# Patient Record
Sex: Male | Born: 1935 | Race: Black or African American | Hispanic: No | Marital: Married | Smoking: Never smoker
Health system: Southern US, Community
[De-identification: ages and names within clinical notes are randomized; demographics above are authoritative.]

---

## 2002-09-18 ENCOUNTER — Encounter: Payer: Self-pay | Admitting: Neurosurgery

## 2002-09-20 ENCOUNTER — Encounter: Payer: Self-pay | Admitting: Neurosurgery

## 2002-09-20 ENCOUNTER — Inpatient Hospital Stay (HOSPITAL_COMMUNITY): Admission: RE | Admit: 2002-09-20 | Discharge: 2002-09-21 | Payer: Self-pay | Admitting: Neurosurgery

## 2002-10-16 ENCOUNTER — Encounter: Admission: RE | Admit: 2002-10-16 | Discharge: 2002-10-16 | Payer: Self-pay | Admitting: Neurosurgery

## 2002-10-16 ENCOUNTER — Encounter: Payer: Self-pay | Admitting: Neurosurgery

## 2004-04-23 ENCOUNTER — Ambulatory Visit: Payer: Self-pay | Admitting: Family Medicine

## 2004-05-29 ENCOUNTER — Encounter: Admission: RE | Admit: 2004-05-29 | Discharge: 2004-05-29 | Payer: Self-pay | Admitting: Neurosurgery

## 2004-06-12 ENCOUNTER — Encounter: Admission: RE | Admit: 2004-06-12 | Discharge: 2004-06-12 | Payer: Self-pay | Admitting: Neurosurgery

## 2004-06-30 ENCOUNTER — Encounter: Admission: RE | Admit: 2004-06-30 | Discharge: 2004-06-30 | Payer: Self-pay | Admitting: Neurosurgery

## 2004-07-24 ENCOUNTER — Inpatient Hospital Stay (HOSPITAL_COMMUNITY): Admission: RE | Admit: 2004-07-24 | Discharge: 2004-07-25 | Payer: Self-pay | Admitting: Neurosurgery

## 2004-08-05 ENCOUNTER — Emergency Department (HOSPITAL_COMMUNITY): Admission: EM | Admit: 2004-08-05 | Discharge: 2004-08-06 | Payer: Self-pay | Admitting: Emergency Medicine

## 2004-08-07 ENCOUNTER — Ambulatory Visit: Payer: Self-pay | Admitting: Family Medicine

## 2004-08-27 ENCOUNTER — Ambulatory Visit: Payer: Self-pay | Admitting: Family Medicine

## 2004-10-29 ENCOUNTER — Ambulatory Visit: Payer: Self-pay | Admitting: Family Medicine

## 2004-12-31 ENCOUNTER — Ambulatory Visit: Payer: Self-pay | Admitting: Family Medicine

## 2005-03-10 ENCOUNTER — Ambulatory Visit: Payer: Self-pay | Admitting: Family Medicine

## 2005-04-09 IMAGING — CT CT ABDOMEN W/ CM
1 of 3 series · 13 of 32 positions shown, 18 images · IV contrast (omnipaque)
Comparison: none

CLINICAL DATA: Abdominal pain for one day.  Question diverticulitis.  Patient is status-post laminectomy on 07/24/04.
TECHNIQUE: Contiguous axial CT images were taken through the abdomen and pelvis after the administration of 100 cc of Omnipaque 300 contrast material.

[Series 2: abd pelvis · axial · 0.70mm/px · z∈[-417,+23]mm · 13 of 102 slices shown, 18 images]
[im 7/102  soft-tissue]
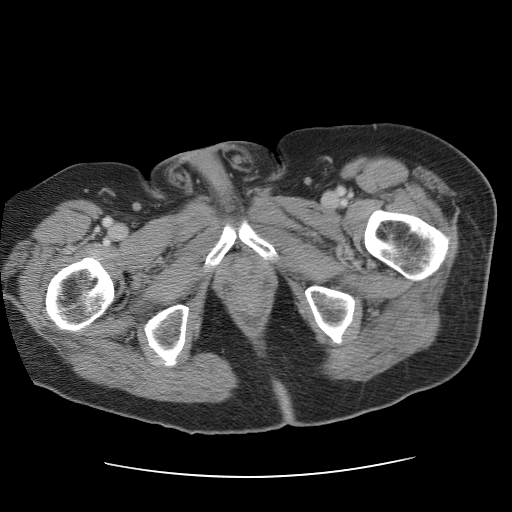
[im 7/102  bone]
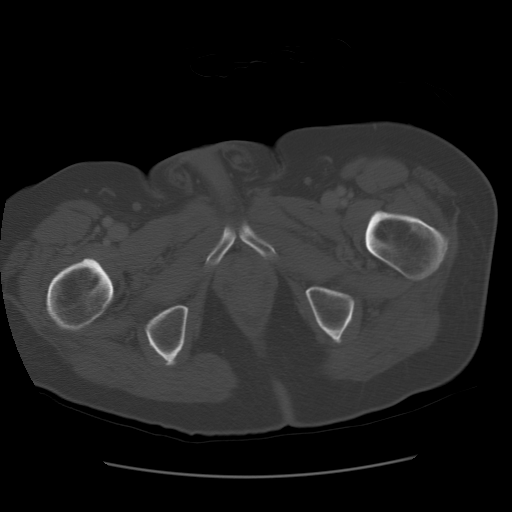
[im 13/102  soft-tissue]
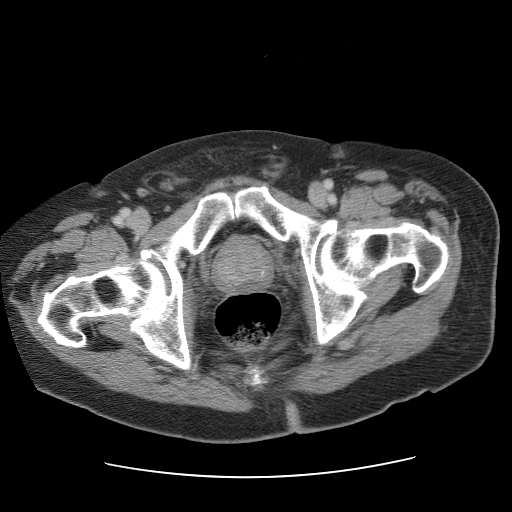
[im 26/102  soft-tissue]
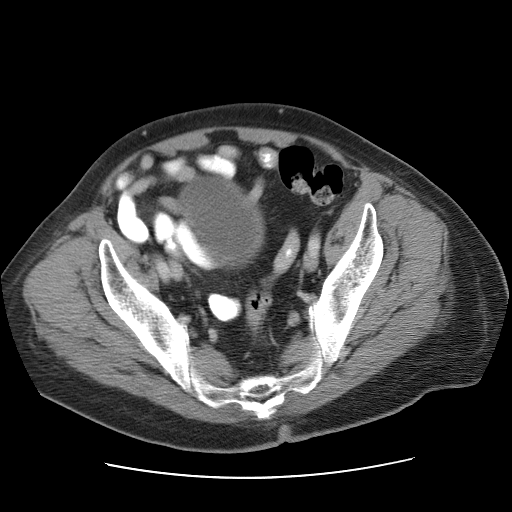
[im 32/102  soft-tissue]
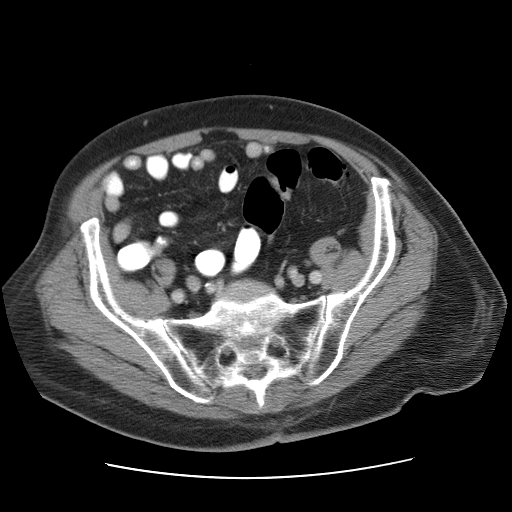
[im 38/102  soft-tissue]
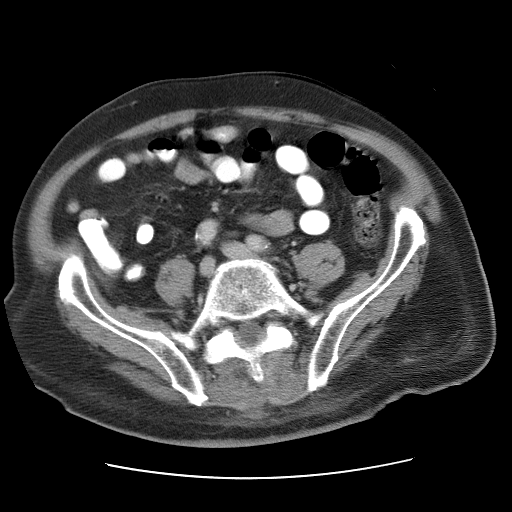
[im 45/102  soft-tissue]
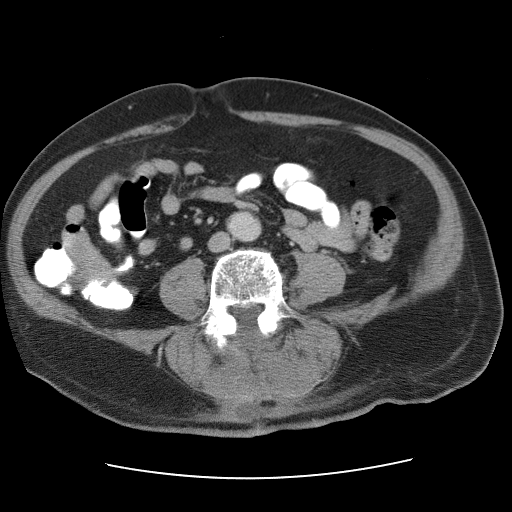
[im 57/102  soft-tissue]
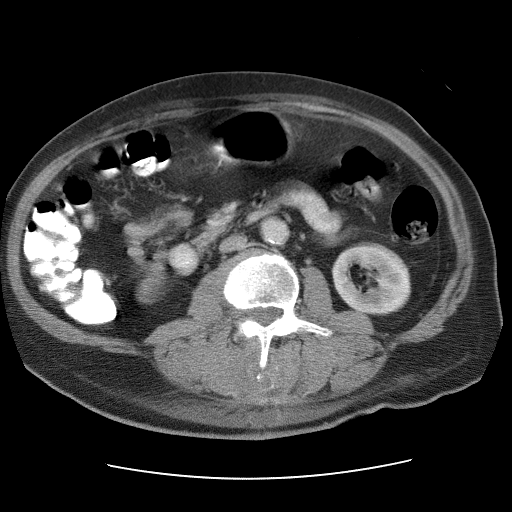
[im 64/102  soft-tissue]
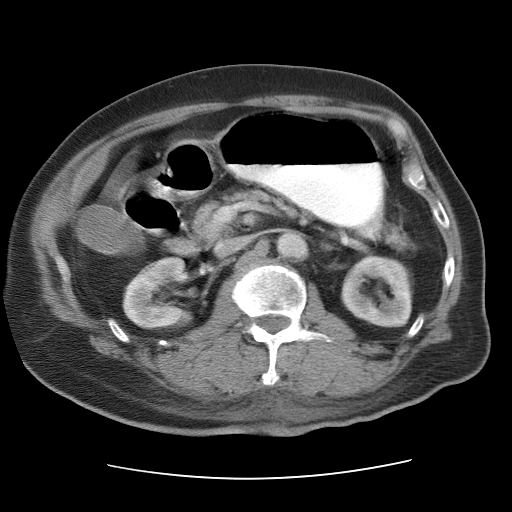
[im 70/102  soft-tissue]
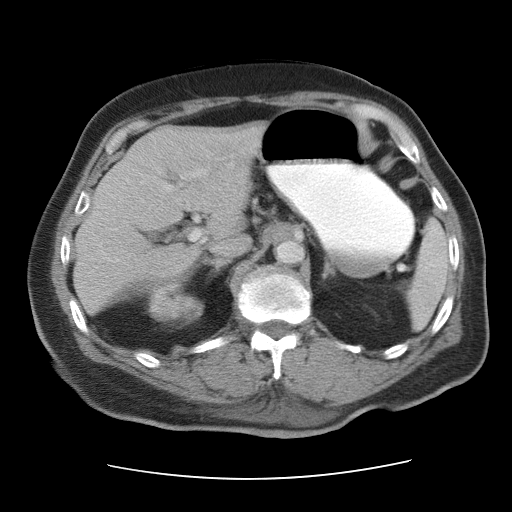
[im 70/102  bone]
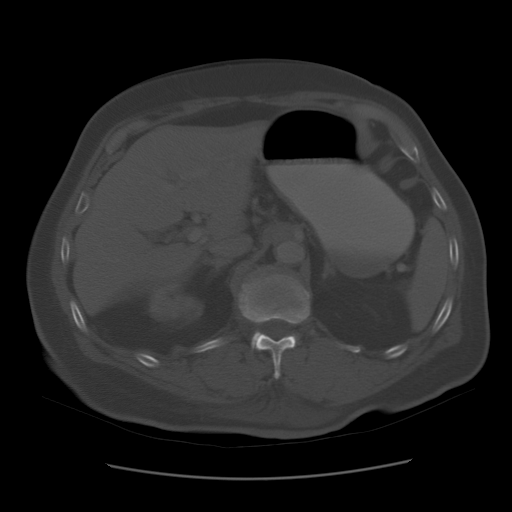
[im 76/102  soft-tissue]
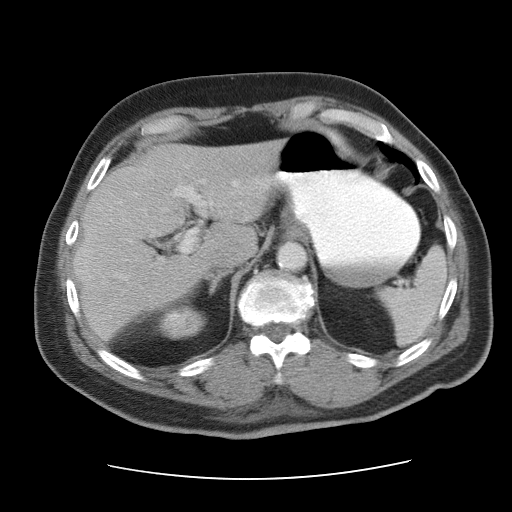
[im 76/102  lung]
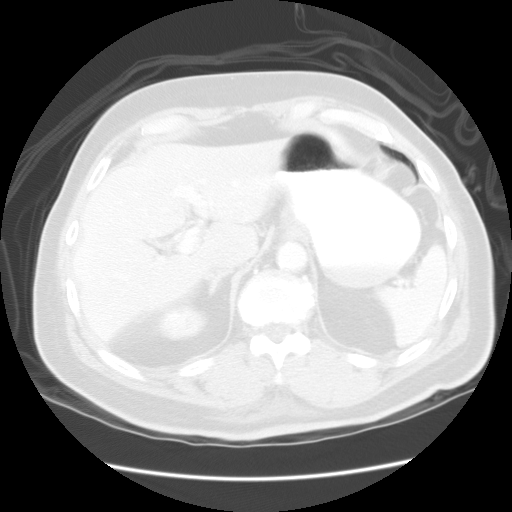
[im 83/102  lung]
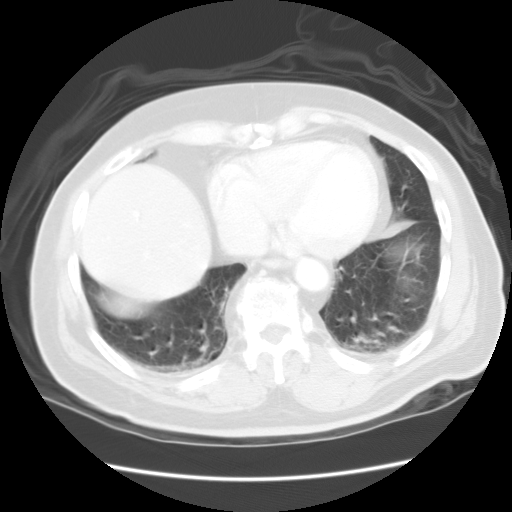
[im 89/102  soft-tissue]
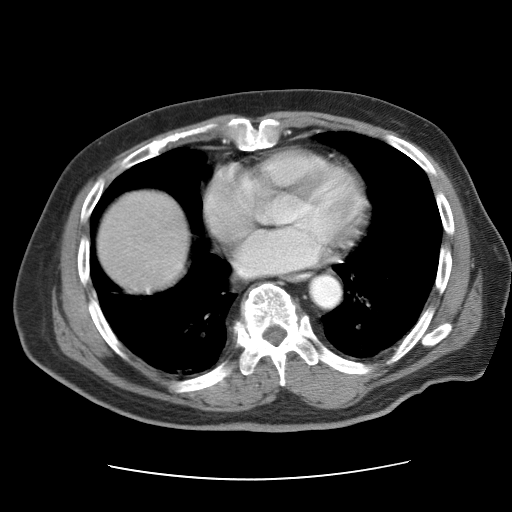
[im 89/102  lung]
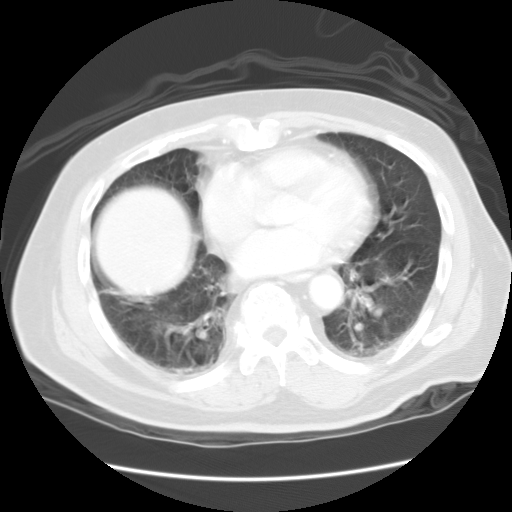
[im 95/102  soft-tissue]
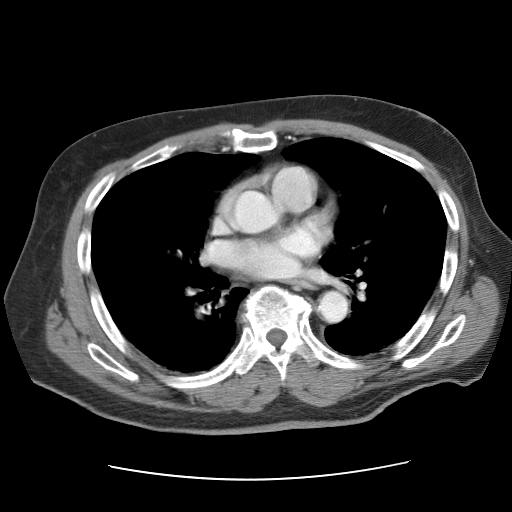
[im 95/102  lung]
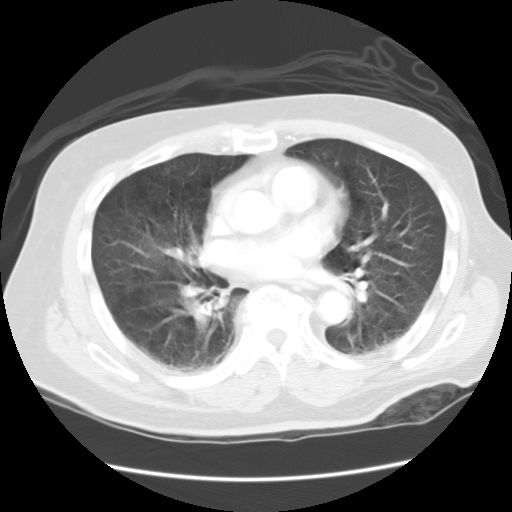

[13 of 32 positions shown; findings below may reference images not displayed]

FINDINGS: CT ABDOMEN WITH CONTRAST:
 Imaged lung parenchyma demonstrates some dependent atelectasis but no pleural or pericardial effusion.  Heart size is normal.  
 Images of the abdomen are degraded by patient motion.  Liver, gallbladder, spleen, pancreas, kidneys, and adrenal glands are all unremarkable.  Note is made of fluid tracking along the subcutaneous soft tissues of the back in this patient who is status-post laminectomy from L-3 to L-5 on 07/24/04.  No abdominal lymphadenopathy or fluid collection is seen.
IMPRESSION: No acute findings in the abdomen.  Subcutaneous fluid tracking along the back is compatible with patient?s history of laminectomy.  
 CT PELVIS WITH CONTRAST:
 As in the abdomen, imaging is degraded by motion.  On images 53 to 57, the distal abdominal aorta demonstrates a line of hypointense signal through the contrast column.  This could be secondary to patient motion although limited dissection cannot be completely excluded.  Imaged colon appears normal.  No diverticulitis.  Prostate gland is enlarged with some calcifications present.  Urinary bladder appears normal.  No free pelvic fluid collections or lymphadenopathy.  Patient is status-post laminectomy from L-3 to L-5.  There is fluid and high attenuation material in the surgical bed likely representing hematoma and possibly a small postoperative seroma formation.
IMPRESSION: 1.  Question limited distal abdominal aortic dissection.  The finding could be secondary to artifact due to patient motion.  Note is made that the patient?s creatinine was 1.4 on today?s scan.  Repeat abdomen and pelvis CT scan per CT angiogram protocol is recommended in one to two days with recheck of creatine prior to scanning.
 2.  Postoperative change in the lower lumbar spine.  No discrete abscess is identified on today?s study.  Soft tissue fullness and fluid in the paraspinous soft tissues and subcutaneous tissues may reflect postoperative change.  Early phlegmon formation cannot be completely excluded.

## 2005-04-22 ENCOUNTER — Ambulatory Visit: Payer: Self-pay | Admitting: Family Medicine

## 2005-05-20 ENCOUNTER — Ambulatory Visit: Payer: Self-pay | Admitting: Family Medicine

## 2005-06-03 ENCOUNTER — Ambulatory Visit: Payer: Self-pay | Admitting: Family Medicine

## 2006-01-27 ENCOUNTER — Encounter: Admission: RE | Admit: 2006-01-27 | Discharge: 2006-01-27 | Payer: Self-pay | Admitting: Neurosurgery

## 2010-06-14 ENCOUNTER — Encounter: Payer: Self-pay | Admitting: Vascular Surgery

## 2014-05-29 DIAGNOSIS — D649 Anemia, unspecified: Secondary | ICD-10-CM | POA: Diagnosis not present

## 2014-06-03 DIAGNOSIS — D631 Anemia in chronic kidney disease: Secondary | ICD-10-CM | POA: Diagnosis not present

## 2014-06-03 DIAGNOSIS — N189 Chronic kidney disease, unspecified: Secondary | ICD-10-CM | POA: Diagnosis not present

## 2014-06-04 DIAGNOSIS — D631 Anemia in chronic kidney disease: Secondary | ICD-10-CM | POA: Diagnosis not present

## 2014-06-04 DIAGNOSIS — N189 Chronic kidney disease, unspecified: Secondary | ICD-10-CM | POA: Diagnosis not present

## 2014-06-25 DIAGNOSIS — E784 Other hyperlipidemia: Secondary | ICD-10-CM | POA: Diagnosis not present

## 2014-06-25 DIAGNOSIS — D649 Anemia, unspecified: Secondary | ICD-10-CM | POA: Diagnosis not present

## 2014-06-25 DIAGNOSIS — I10 Essential (primary) hypertension: Secondary | ICD-10-CM | POA: Diagnosis not present

## 2014-06-25 DIAGNOSIS — E0590 Thyrotoxicosis, unspecified without thyrotoxic crisis or storm: Secondary | ICD-10-CM | POA: Diagnosis not present

## 2014-06-25 DIAGNOSIS — K219 Gastro-esophageal reflux disease without esophagitis: Secondary | ICD-10-CM | POA: Diagnosis not present

## 2014-06-25 DIAGNOSIS — G894 Chronic pain syndrome: Secondary | ICD-10-CM | POA: Diagnosis not present

## 2014-06-25 DIAGNOSIS — I2510 Atherosclerotic heart disease of native coronary artery without angina pectoris: Secondary | ICD-10-CM | POA: Diagnosis not present

## 2014-07-04 DIAGNOSIS — D649 Anemia, unspecified: Secondary | ICD-10-CM | POA: Diagnosis not present

## 2014-07-31 DIAGNOSIS — E0590 Thyrotoxicosis, unspecified without thyrotoxic crisis or storm: Secondary | ICD-10-CM | POA: Diagnosis not present

## 2014-07-31 DIAGNOSIS — I10 Essential (primary) hypertension: Secondary | ICD-10-CM | POA: Diagnosis not present

## 2014-07-31 DIAGNOSIS — D649 Anemia, unspecified: Secondary | ICD-10-CM | POA: Diagnosis not present

## 2014-08-02 DIAGNOSIS — D649 Anemia, unspecified: Secondary | ICD-10-CM | POA: Diagnosis not present

## 2014-09-02 DIAGNOSIS — D631 Anemia in chronic kidney disease: Secondary | ICD-10-CM | POA: Diagnosis not present

## 2014-09-02 DIAGNOSIS — N189 Chronic kidney disease, unspecified: Secondary | ICD-10-CM | POA: Diagnosis not present

## 2014-10-02 DIAGNOSIS — D649 Anemia, unspecified: Secondary | ICD-10-CM | POA: Diagnosis not present

## 2014-10-02 DIAGNOSIS — D631 Anemia in chronic kidney disease: Secondary | ICD-10-CM | POA: Diagnosis not present

## 2014-10-02 DIAGNOSIS — N189 Chronic kidney disease, unspecified: Secondary | ICD-10-CM | POA: Diagnosis not present

## 2014-11-01 DIAGNOSIS — D649 Anemia, unspecified: Secondary | ICD-10-CM | POA: Diagnosis not present

## 2014-12-02 DIAGNOSIS — N189 Chronic kidney disease, unspecified: Secondary | ICD-10-CM | POA: Diagnosis not present

## 2014-12-02 DIAGNOSIS — D631 Anemia in chronic kidney disease: Secondary | ICD-10-CM | POA: Diagnosis not present

## 2015-01-10 DIAGNOSIS — E0590 Thyrotoxicosis, unspecified without thyrotoxic crisis or storm: Secondary | ICD-10-CM | POA: Diagnosis not present

## 2015-01-10 DIAGNOSIS — Z23 Encounter for immunization: Secondary | ICD-10-CM | POA: Diagnosis not present

## 2015-01-10 DIAGNOSIS — D649 Anemia, unspecified: Secondary | ICD-10-CM | POA: Diagnosis not present

## 2015-01-10 DIAGNOSIS — N189 Chronic kidney disease, unspecified: Secondary | ICD-10-CM | POA: Diagnosis not present

## 2015-01-10 DIAGNOSIS — G894 Chronic pain syndrome: Secondary | ICD-10-CM | POA: Diagnosis not present

## 2015-01-10 DIAGNOSIS — E784 Other hyperlipidemia: Secondary | ICD-10-CM | POA: Diagnosis not present

## 2015-01-10 DIAGNOSIS — I10 Essential (primary) hypertension: Secondary | ICD-10-CM | POA: Diagnosis not present

## 2015-01-13 DIAGNOSIS — E785 Hyperlipidemia, unspecified: Secondary | ICD-10-CM | POA: Diagnosis not present

## 2015-01-13 DIAGNOSIS — G894 Chronic pain syndrome: Secondary | ICD-10-CM | POA: Diagnosis not present

## 2015-01-13 DIAGNOSIS — I10 Essential (primary) hypertension: Secondary | ICD-10-CM | POA: Diagnosis not present

## 2015-01-13 DIAGNOSIS — N189 Chronic kidney disease, unspecified: Secondary | ICD-10-CM | POA: Diagnosis not present

## 2015-01-13 DIAGNOSIS — D509 Iron deficiency anemia, unspecified: Secondary | ICD-10-CM | POA: Diagnosis not present

## 2015-01-13 DIAGNOSIS — K219 Gastro-esophageal reflux disease without esophagitis: Secondary | ICD-10-CM | POA: Diagnosis not present

## 2015-01-15 DIAGNOSIS — I10 Essential (primary) hypertension: Secondary | ICD-10-CM | POA: Diagnosis not present

## 2015-01-15 DIAGNOSIS — G894 Chronic pain syndrome: Secondary | ICD-10-CM | POA: Diagnosis not present

## 2015-01-15 DIAGNOSIS — N189 Chronic kidney disease, unspecified: Secondary | ICD-10-CM | POA: Diagnosis not present

## 2015-01-15 DIAGNOSIS — K219 Gastro-esophageal reflux disease without esophagitis: Secondary | ICD-10-CM | POA: Diagnosis not present

## 2015-01-15 DIAGNOSIS — E785 Hyperlipidemia, unspecified: Secondary | ICD-10-CM | POA: Diagnosis not present

## 2015-01-15 DIAGNOSIS — D509 Iron deficiency anemia, unspecified: Secondary | ICD-10-CM | POA: Diagnosis not present

## 2015-01-16 DIAGNOSIS — K219 Gastro-esophageal reflux disease without esophagitis: Secondary | ICD-10-CM | POA: Diagnosis not present

## 2015-01-16 DIAGNOSIS — G894 Chronic pain syndrome: Secondary | ICD-10-CM | POA: Diagnosis not present

## 2015-01-16 DIAGNOSIS — I10 Essential (primary) hypertension: Secondary | ICD-10-CM | POA: Diagnosis not present

## 2015-01-16 DIAGNOSIS — N189 Chronic kidney disease, unspecified: Secondary | ICD-10-CM | POA: Diagnosis not present

## 2015-01-16 DIAGNOSIS — D509 Iron deficiency anemia, unspecified: Secondary | ICD-10-CM | POA: Diagnosis not present

## 2015-01-16 DIAGNOSIS — E785 Hyperlipidemia, unspecified: Secondary | ICD-10-CM | POA: Diagnosis not present

## 2015-01-17 DIAGNOSIS — E785 Hyperlipidemia, unspecified: Secondary | ICD-10-CM | POA: Diagnosis not present

## 2015-01-17 DIAGNOSIS — G894 Chronic pain syndrome: Secondary | ICD-10-CM | POA: Diagnosis not present

## 2015-01-17 DIAGNOSIS — K219 Gastro-esophageal reflux disease without esophagitis: Secondary | ICD-10-CM | POA: Diagnosis not present

## 2015-01-17 DIAGNOSIS — D509 Iron deficiency anemia, unspecified: Secondary | ICD-10-CM | POA: Diagnosis not present

## 2015-01-17 DIAGNOSIS — I10 Essential (primary) hypertension: Secondary | ICD-10-CM | POA: Diagnosis not present

## 2015-01-17 DIAGNOSIS — N189 Chronic kidney disease, unspecified: Secondary | ICD-10-CM | POA: Diagnosis not present

## 2015-01-21 DIAGNOSIS — K219 Gastro-esophageal reflux disease without esophagitis: Secondary | ICD-10-CM | POA: Diagnosis not present

## 2015-01-21 DIAGNOSIS — G894 Chronic pain syndrome: Secondary | ICD-10-CM | POA: Diagnosis not present

## 2015-01-21 DIAGNOSIS — N189 Chronic kidney disease, unspecified: Secondary | ICD-10-CM | POA: Diagnosis not present

## 2015-01-21 DIAGNOSIS — I10 Essential (primary) hypertension: Secondary | ICD-10-CM | POA: Diagnosis not present

## 2015-01-21 DIAGNOSIS — D509 Iron deficiency anemia, unspecified: Secondary | ICD-10-CM | POA: Diagnosis not present

## 2015-01-21 DIAGNOSIS — E785 Hyperlipidemia, unspecified: Secondary | ICD-10-CM | POA: Diagnosis not present

## 2015-01-24 DIAGNOSIS — N189 Chronic kidney disease, unspecified: Secondary | ICD-10-CM | POA: Diagnosis not present

## 2015-01-24 DIAGNOSIS — D509 Iron deficiency anemia, unspecified: Secondary | ICD-10-CM | POA: Diagnosis not present

## 2015-01-24 DIAGNOSIS — E785 Hyperlipidemia, unspecified: Secondary | ICD-10-CM | POA: Diagnosis not present

## 2015-01-24 DIAGNOSIS — I10 Essential (primary) hypertension: Secondary | ICD-10-CM | POA: Diagnosis not present

## 2015-01-24 DIAGNOSIS — K219 Gastro-esophageal reflux disease without esophagitis: Secondary | ICD-10-CM | POA: Diagnosis not present

## 2015-01-24 DIAGNOSIS — G894 Chronic pain syndrome: Secondary | ICD-10-CM | POA: Diagnosis not present

## 2015-01-28 DIAGNOSIS — I10 Essential (primary) hypertension: Secondary | ICD-10-CM | POA: Diagnosis not present

## 2015-01-28 DIAGNOSIS — G894 Chronic pain syndrome: Secondary | ICD-10-CM | POA: Diagnosis not present

## 2015-01-30 DIAGNOSIS — K219 Gastro-esophageal reflux disease without esophagitis: Secondary | ICD-10-CM | POA: Diagnosis not present

## 2015-01-30 DIAGNOSIS — D509 Iron deficiency anemia, unspecified: Secondary | ICD-10-CM | POA: Diagnosis not present

## 2015-01-30 DIAGNOSIS — I10 Essential (primary) hypertension: Secondary | ICD-10-CM | POA: Diagnosis not present

## 2015-01-30 DIAGNOSIS — E785 Hyperlipidemia, unspecified: Secondary | ICD-10-CM | POA: Diagnosis not present

## 2015-01-30 DIAGNOSIS — N189 Chronic kidney disease, unspecified: Secondary | ICD-10-CM | POA: Diagnosis not present

## 2015-01-30 DIAGNOSIS — G894 Chronic pain syndrome: Secondary | ICD-10-CM | POA: Diagnosis not present

## 2015-02-04 DIAGNOSIS — I10 Essential (primary) hypertension: Secondary | ICD-10-CM | POA: Diagnosis not present

## 2015-02-04 DIAGNOSIS — G894 Chronic pain syndrome: Secondary | ICD-10-CM | POA: Diagnosis not present

## 2015-02-04 DIAGNOSIS — D509 Iron deficiency anemia, unspecified: Secondary | ICD-10-CM | POA: Diagnosis not present

## 2015-02-04 DIAGNOSIS — N189 Chronic kidney disease, unspecified: Secondary | ICD-10-CM | POA: Diagnosis not present

## 2015-02-04 DIAGNOSIS — E785 Hyperlipidemia, unspecified: Secondary | ICD-10-CM | POA: Diagnosis not present

## 2015-02-04 DIAGNOSIS — K219 Gastro-esophageal reflux disease without esophagitis: Secondary | ICD-10-CM | POA: Diagnosis not present

## 2015-02-24 DIAGNOSIS — N189 Chronic kidney disease, unspecified: Secondary | ICD-10-CM | POA: Diagnosis not present

## 2015-02-24 DIAGNOSIS — D631 Anemia in chronic kidney disease: Secondary | ICD-10-CM | POA: Diagnosis not present

## 2015-04-25 DIAGNOSIS — D649 Anemia, unspecified: Secondary | ICD-10-CM | POA: Diagnosis not present

## 2015-06-26 DIAGNOSIS — N189 Chronic kidney disease, unspecified: Secondary | ICD-10-CM | POA: Diagnosis not present

## 2015-06-26 DIAGNOSIS — D631 Anemia in chronic kidney disease: Secondary | ICD-10-CM | POA: Diagnosis not present

## 2015-06-27 DIAGNOSIS — D631 Anemia in chronic kidney disease: Secondary | ICD-10-CM | POA: Diagnosis not present

## 2015-06-27 DIAGNOSIS — N189 Chronic kidney disease, unspecified: Secondary | ICD-10-CM | POA: Diagnosis not present

## 2015-07-18 DIAGNOSIS — N183 Chronic kidney disease, stage 3 (moderate): Secondary | ICD-10-CM | POA: Diagnosis not present

## 2015-07-18 DIAGNOSIS — G894 Chronic pain syndrome: Secondary | ICD-10-CM | POA: Diagnosis not present

## 2015-07-18 DIAGNOSIS — E782 Mixed hyperlipidemia: Secondary | ICD-10-CM | POA: Diagnosis not present

## 2015-07-18 DIAGNOSIS — I129 Hypertensive chronic kidney disease with stage 1 through stage 4 chronic kidney disease, or unspecified chronic kidney disease: Secondary | ICD-10-CM | POA: Diagnosis not present

## 2015-07-18 DIAGNOSIS — E0590 Thyrotoxicosis, unspecified without thyrotoxic crisis or storm: Secondary | ICD-10-CM | POA: Diagnosis not present

## 2015-07-18 DIAGNOSIS — Z131 Encounter for screening for diabetes mellitus: Secondary | ICD-10-CM | POA: Diagnosis not present

## 2015-08-07 DIAGNOSIS — D631 Anemia in chronic kidney disease: Secondary | ICD-10-CM | POA: Diagnosis not present

## 2015-08-07 DIAGNOSIS — N189 Chronic kidney disease, unspecified: Secondary | ICD-10-CM | POA: Diagnosis not present

## 2015-09-04 DIAGNOSIS — I10 Essential (primary) hypertension: Secondary | ICD-10-CM | POA: Diagnosis not present

## 2015-09-04 DIAGNOSIS — G894 Chronic pain syndrome: Secondary | ICD-10-CM | POA: Diagnosis not present

## 2015-09-18 DIAGNOSIS — N189 Chronic kidney disease, unspecified: Secondary | ICD-10-CM | POA: Diagnosis not present

## 2015-09-18 DIAGNOSIS — D631 Anemia in chronic kidney disease: Secondary | ICD-10-CM | POA: Diagnosis not present

## 2015-11-18 DIAGNOSIS — D649 Anemia, unspecified: Secondary | ICD-10-CM | POA: Diagnosis not present

## 2015-11-19 DIAGNOSIS — D631 Anemia in chronic kidney disease: Secondary | ICD-10-CM | POA: Diagnosis not present

## 2015-11-19 DIAGNOSIS — N189 Chronic kidney disease, unspecified: Secondary | ICD-10-CM | POA: Diagnosis not present

## 2016-01-19 DIAGNOSIS — N189 Chronic kidney disease, unspecified: Secondary | ICD-10-CM | POA: Diagnosis not present

## 2016-01-19 DIAGNOSIS — D631 Anemia in chronic kidney disease: Secondary | ICD-10-CM | POA: Diagnosis not present

## 2016-01-28 DIAGNOSIS — Z131 Encounter for screening for diabetes mellitus: Secondary | ICD-10-CM | POA: Diagnosis not present

## 2016-01-28 DIAGNOSIS — I129 Hypertensive chronic kidney disease with stage 1 through stage 4 chronic kidney disease, or unspecified chronic kidney disease: Secondary | ICD-10-CM | POA: Diagnosis not present

## 2016-01-28 DIAGNOSIS — G894 Chronic pain syndrome: Secondary | ICD-10-CM | POA: Diagnosis not present

## 2016-01-28 DIAGNOSIS — E782 Mixed hyperlipidemia: Secondary | ICD-10-CM | POA: Diagnosis not present

## 2016-01-28 DIAGNOSIS — I25119 Atherosclerotic heart disease of native coronary artery with unspecified angina pectoris: Secondary | ICD-10-CM | POA: Diagnosis not present

## 2016-01-28 DIAGNOSIS — N183 Chronic kidney disease, stage 3 (moderate): Secondary | ICD-10-CM | POA: Diagnosis not present

## 2016-01-28 DIAGNOSIS — E0590 Thyrotoxicosis, unspecified without thyrotoxic crisis or storm: Secondary | ICD-10-CM | POA: Diagnosis not present

## 2016-03-10 DIAGNOSIS — Z23 Encounter for immunization: Secondary | ICD-10-CM | POA: Diagnosis not present

## 2016-03-29 DIAGNOSIS — E0590 Thyrotoxicosis, unspecified without thyrotoxic crisis or storm: Secondary | ICD-10-CM | POA: Diagnosis not present

## 2016-04-23 DIAGNOSIS — N289 Disorder of kidney and ureter, unspecified: Secondary | ICD-10-CM | POA: Diagnosis not present

## 2016-04-23 DIAGNOSIS — G9341 Metabolic encephalopathy: Secondary | ICD-10-CM | POA: Diagnosis not present

## 2016-04-23 DIAGNOSIS — I471 Supraventricular tachycardia: Secondary | ICD-10-CM | POA: Diagnosis present

## 2016-04-23 DIAGNOSIS — R55 Syncope and collapse: Secondary | ICD-10-CM | POA: Diagnosis not present

## 2016-04-23 DIAGNOSIS — I161 Hypertensive emergency: Secondary | ICD-10-CM | POA: Diagnosis not present

## 2016-04-23 DIAGNOSIS — N3000 Acute cystitis without hematuria: Secondary | ICD-10-CM | POA: Diagnosis not present

## 2016-04-23 DIAGNOSIS — B952 Enterococcus as the cause of diseases classified elsewhere: Secondary | ICD-10-CM | POA: Diagnosis present

## 2016-04-23 DIAGNOSIS — Z23 Encounter for immunization: Secondary | ICD-10-CM | POA: Diagnosis not present

## 2016-04-23 DIAGNOSIS — I4892 Unspecified atrial flutter: Secondary | ICD-10-CM | POA: Diagnosis not present

## 2016-04-23 DIAGNOSIS — M1990 Unspecified osteoarthritis, unspecified site: Secondary | ICD-10-CM | POA: Diagnosis present

## 2016-04-23 DIAGNOSIS — I130 Hypertensive heart and chronic kidney disease with heart failure and stage 1 through stage 4 chronic kidney disease, or unspecified chronic kidney disease: Secondary | ICD-10-CM | POA: Diagnosis not present

## 2016-04-23 DIAGNOSIS — K219 Gastro-esophageal reflux disease without esophagitis: Secondary | ICD-10-CM | POA: Diagnosis present

## 2016-04-23 DIAGNOSIS — F418 Other specified anxiety disorders: Secondary | ICD-10-CM | POA: Diagnosis present

## 2016-04-23 DIAGNOSIS — Z79899 Other long term (current) drug therapy: Secondary | ICD-10-CM | POA: Diagnosis not present

## 2016-04-23 DIAGNOSIS — R000 Tachycardia, unspecified: Secondary | ICD-10-CM | POA: Diagnosis not present

## 2016-04-23 DIAGNOSIS — I129 Hypertensive chronic kidney disease with stage 1 through stage 4 chronic kidney disease, or unspecified chronic kidney disease: Secondary | ICD-10-CM | POA: Diagnosis present

## 2016-04-23 DIAGNOSIS — N183 Chronic kidney disease, stage 3 (moderate): Secondary | ICD-10-CM | POA: Diagnosis present

## 2016-04-23 DIAGNOSIS — R4182 Altered mental status, unspecified: Secondary | ICD-10-CM | POA: Diagnosis not present

## 2016-04-23 DIAGNOSIS — N184 Chronic kidney disease, stage 4 (severe): Secondary | ICD-10-CM | POA: Diagnosis not present

## 2016-04-23 DIAGNOSIS — E7800 Pure hypercholesterolemia, unspecified: Secondary | ICD-10-CM | POA: Diagnosis present

## 2016-04-23 DIAGNOSIS — I2510 Atherosclerotic heart disease of native coronary artery without angina pectoris: Secondary | ICD-10-CM | POA: Diagnosis present

## 2016-04-23 DIAGNOSIS — I6789 Other cerebrovascular disease: Secondary | ICD-10-CM | POA: Diagnosis not present

## 2016-04-23 DIAGNOSIS — N390 Urinary tract infection, site not specified: Secondary | ICD-10-CM | POA: Diagnosis not present

## 2016-04-23 DIAGNOSIS — Z7982 Long term (current) use of aspirin: Secondary | ICD-10-CM | POA: Diagnosis not present

## 2016-04-23 DIAGNOSIS — I252 Old myocardial infarction: Secondary | ICD-10-CM | POA: Diagnosis not present

## 2016-04-23 DIAGNOSIS — R5381 Other malaise: Secondary | ICD-10-CM | POA: Diagnosis present

## 2016-04-23 DIAGNOSIS — R531 Weakness: Secondary | ICD-10-CM | POA: Diagnosis not present

## 2016-04-23 DIAGNOSIS — A419 Sepsis, unspecified organism: Secondary | ICD-10-CM | POA: Diagnosis not present

## 2016-04-26 DIAGNOSIS — R000 Tachycardia, unspecified: Secondary | ICD-10-CM

## 2016-04-26 DIAGNOSIS — N183 Chronic kidney disease, stage 3 (moderate): Secondary | ICD-10-CM

## 2016-04-27 DIAGNOSIS — F419 Anxiety disorder, unspecified: Secondary | ICD-10-CM | POA: Diagnosis not present

## 2016-04-27 DIAGNOSIS — R5381 Other malaise: Secondary | ICD-10-CM | POA: Diagnosis not present

## 2016-04-27 DIAGNOSIS — B952 Enterococcus as the cause of diseases classified elsewhere: Secondary | ICD-10-CM | POA: Diagnosis not present

## 2016-04-27 DIAGNOSIS — N183 Chronic kidney disease, stage 3 (moderate): Secondary | ICD-10-CM | POA: Diagnosis not present

## 2016-04-27 DIAGNOSIS — I2510 Atherosclerotic heart disease of native coronary artery without angina pectoris: Secondary | ICD-10-CM | POA: Diagnosis not present

## 2016-04-27 DIAGNOSIS — N3000 Acute cystitis without hematuria: Secondary | ICD-10-CM | POA: Diagnosis not present

## 2016-04-27 DIAGNOSIS — I129 Hypertensive chronic kidney disease with stage 1 through stage 4 chronic kidney disease, or unspecified chronic kidney disease: Secondary | ICD-10-CM | POA: Diagnosis not present

## 2016-04-27 DIAGNOSIS — Z7982 Long term (current) use of aspirin: Secondary | ICD-10-CM | POA: Diagnosis not present

## 2016-04-27 DIAGNOSIS — F329 Major depressive disorder, single episode, unspecified: Secondary | ICD-10-CM | POA: Diagnosis not present

## 2016-04-27 DIAGNOSIS — M1991 Primary osteoarthritis, unspecified site: Secondary | ICD-10-CM | POA: Diagnosis not present

## 2016-04-30 DIAGNOSIS — I2510 Atherosclerotic heart disease of native coronary artery without angina pectoris: Secondary | ICD-10-CM | POA: Diagnosis not present

## 2016-04-30 DIAGNOSIS — I129 Hypertensive chronic kidney disease with stage 1 through stage 4 chronic kidney disease, or unspecified chronic kidney disease: Secondary | ICD-10-CM | POA: Diagnosis not present

## 2016-04-30 DIAGNOSIS — B952 Enterococcus as the cause of diseases classified elsewhere: Secondary | ICD-10-CM | POA: Diagnosis not present

## 2016-04-30 DIAGNOSIS — N3000 Acute cystitis without hematuria: Secondary | ICD-10-CM | POA: Diagnosis not present

## 2016-04-30 DIAGNOSIS — R5381 Other malaise: Secondary | ICD-10-CM | POA: Diagnosis not present

## 2016-04-30 DIAGNOSIS — N183 Chronic kidney disease, stage 3 (moderate): Secondary | ICD-10-CM | POA: Diagnosis not present

## 2016-05-03 DIAGNOSIS — N183 Chronic kidney disease, stage 3 (moderate): Secondary | ICD-10-CM | POA: Diagnosis not present

## 2016-05-03 DIAGNOSIS — I2510 Atherosclerotic heart disease of native coronary artery without angina pectoris: Secondary | ICD-10-CM | POA: Diagnosis not present

## 2016-05-03 DIAGNOSIS — I129 Hypertensive chronic kidney disease with stage 1 through stage 4 chronic kidney disease, or unspecified chronic kidney disease: Secondary | ICD-10-CM | POA: Diagnosis not present

## 2016-05-03 DIAGNOSIS — B952 Enterococcus as the cause of diseases classified elsewhere: Secondary | ICD-10-CM | POA: Diagnosis not present

## 2016-05-03 DIAGNOSIS — R5381 Other malaise: Secondary | ICD-10-CM | POA: Diagnosis not present

## 2016-05-03 DIAGNOSIS — N3000 Acute cystitis without hematuria: Secondary | ICD-10-CM | POA: Diagnosis not present

## 2016-05-05 DIAGNOSIS — I129 Hypertensive chronic kidney disease with stage 1 through stage 4 chronic kidney disease, or unspecified chronic kidney disease: Secondary | ICD-10-CM | POA: Diagnosis not present

## 2016-05-05 DIAGNOSIS — N183 Chronic kidney disease, stage 3 (moderate): Secondary | ICD-10-CM | POA: Diagnosis not present

## 2016-05-05 DIAGNOSIS — R5381 Other malaise: Secondary | ICD-10-CM | POA: Diagnosis not present

## 2016-05-05 DIAGNOSIS — I2510 Atherosclerotic heart disease of native coronary artery without angina pectoris: Secondary | ICD-10-CM | POA: Diagnosis not present

## 2016-05-05 DIAGNOSIS — N3000 Acute cystitis without hematuria: Secondary | ICD-10-CM | POA: Diagnosis not present

## 2016-05-05 DIAGNOSIS — B952 Enterococcus as the cause of diseases classified elsewhere: Secondary | ICD-10-CM | POA: Diagnosis not present

## 2016-05-06 DIAGNOSIS — I824Z2 Acute embolism and thrombosis of unspecified deep veins of left distal lower extremity: Secondary | ICD-10-CM | POA: Diagnosis not present

## 2016-05-06 DIAGNOSIS — I25119 Atherosclerotic heart disease of native coronary artery with unspecified angina pectoris: Secondary | ICD-10-CM | POA: Diagnosis not present

## 2016-05-06 DIAGNOSIS — R0902 Hypoxemia: Secondary | ICD-10-CM | POA: Diagnosis not present

## 2016-05-06 DIAGNOSIS — G894 Chronic pain syndrome: Secondary | ICD-10-CM | POA: Diagnosis not present

## 2016-05-06 DIAGNOSIS — K521 Toxic gastroenteritis and colitis: Secondary | ICD-10-CM | POA: Diagnosis not present

## 2016-05-06 DIAGNOSIS — I10 Essential (primary) hypertension: Secondary | ICD-10-CM | POA: Diagnosis not present

## 2016-05-06 DIAGNOSIS — M7989 Other specified soft tissue disorders: Secondary | ICD-10-CM | POA: Diagnosis not present

## 2016-05-06 DIAGNOSIS — T3695XA Adverse effect of unspecified systemic antibiotic, initial encounter: Secondary | ICD-10-CM | POA: Diagnosis not present

## 2016-05-07 DIAGNOSIS — B952 Enterococcus as the cause of diseases classified elsewhere: Secondary | ICD-10-CM | POA: Diagnosis not present

## 2016-05-07 DIAGNOSIS — N183 Chronic kidney disease, stage 3 (moderate): Secondary | ICD-10-CM | POA: Diagnosis not present

## 2016-05-07 DIAGNOSIS — R5381 Other malaise: Secondary | ICD-10-CM | POA: Diagnosis not present

## 2016-05-07 DIAGNOSIS — N3000 Acute cystitis without hematuria: Secondary | ICD-10-CM | POA: Diagnosis not present

## 2016-05-07 DIAGNOSIS — I2510 Atherosclerotic heart disease of native coronary artery without angina pectoris: Secondary | ICD-10-CM | POA: Diagnosis not present

## 2016-05-07 DIAGNOSIS — I129 Hypertensive chronic kidney disease with stage 1 through stage 4 chronic kidney disease, or unspecified chronic kidney disease: Secondary | ICD-10-CM | POA: Diagnosis not present

## 2016-05-10 DIAGNOSIS — I2510 Atherosclerotic heart disease of native coronary artery without angina pectoris: Secondary | ICD-10-CM | POA: Diagnosis not present

## 2016-05-10 DIAGNOSIS — B952 Enterococcus as the cause of diseases classified elsewhere: Secondary | ICD-10-CM | POA: Diagnosis not present

## 2016-05-10 DIAGNOSIS — R5381 Other malaise: Secondary | ICD-10-CM | POA: Diagnosis not present

## 2016-05-10 DIAGNOSIS — N183 Chronic kidney disease, stage 3 (moderate): Secondary | ICD-10-CM | POA: Diagnosis not present

## 2016-05-10 DIAGNOSIS — N3000 Acute cystitis without hematuria: Secondary | ICD-10-CM | POA: Diagnosis not present

## 2016-05-10 DIAGNOSIS — I129 Hypertensive chronic kidney disease with stage 1 through stage 4 chronic kidney disease, or unspecified chronic kidney disease: Secondary | ICD-10-CM | POA: Diagnosis not present

## 2016-05-12 DIAGNOSIS — N3000 Acute cystitis without hematuria: Secondary | ICD-10-CM | POA: Diagnosis not present

## 2016-05-12 DIAGNOSIS — I2510 Atherosclerotic heart disease of native coronary artery without angina pectoris: Secondary | ICD-10-CM | POA: Diagnosis not present

## 2016-05-12 DIAGNOSIS — I129 Hypertensive chronic kidney disease with stage 1 through stage 4 chronic kidney disease, or unspecified chronic kidney disease: Secondary | ICD-10-CM | POA: Diagnosis not present

## 2016-05-12 DIAGNOSIS — R5381 Other malaise: Secondary | ICD-10-CM | POA: Diagnosis not present

## 2016-05-12 DIAGNOSIS — N183 Chronic kidney disease, stage 3 (moderate): Secondary | ICD-10-CM | POA: Diagnosis not present

## 2016-05-12 DIAGNOSIS — B952 Enterococcus as the cause of diseases classified elsewhere: Secondary | ICD-10-CM | POA: Diagnosis not present

## 2016-05-14 DIAGNOSIS — N3000 Acute cystitis without hematuria: Secondary | ICD-10-CM | POA: Diagnosis not present

## 2016-05-14 DIAGNOSIS — B952 Enterococcus as the cause of diseases classified elsewhere: Secondary | ICD-10-CM | POA: Diagnosis not present

## 2016-05-14 DIAGNOSIS — R5381 Other malaise: Secondary | ICD-10-CM | POA: Diagnosis not present

## 2016-05-14 DIAGNOSIS — I2510 Atherosclerotic heart disease of native coronary artery without angina pectoris: Secondary | ICD-10-CM | POA: Diagnosis not present

## 2016-05-14 DIAGNOSIS — N183 Chronic kidney disease, stage 3 (moderate): Secondary | ICD-10-CM | POA: Diagnosis not present

## 2016-05-14 DIAGNOSIS — I129 Hypertensive chronic kidney disease with stage 1 through stage 4 chronic kidney disease, or unspecified chronic kidney disease: Secondary | ICD-10-CM | POA: Diagnosis not present

## 2016-05-19 DIAGNOSIS — I129 Hypertensive chronic kidney disease with stage 1 through stage 4 chronic kidney disease, or unspecified chronic kidney disease: Secondary | ICD-10-CM | POA: Diagnosis not present

## 2016-05-19 DIAGNOSIS — I2510 Atherosclerotic heart disease of native coronary artery without angina pectoris: Secondary | ICD-10-CM | POA: Diagnosis not present

## 2016-05-19 DIAGNOSIS — N183 Chronic kidney disease, stage 3 (moderate): Secondary | ICD-10-CM | POA: Diagnosis not present

## 2016-05-19 DIAGNOSIS — R5381 Other malaise: Secondary | ICD-10-CM | POA: Diagnosis not present

## 2016-05-19 DIAGNOSIS — N3000 Acute cystitis without hematuria: Secondary | ICD-10-CM | POA: Diagnosis not present

## 2016-05-19 DIAGNOSIS — B952 Enterococcus as the cause of diseases classified elsewhere: Secondary | ICD-10-CM | POA: Diagnosis not present

## 2016-05-20 DIAGNOSIS — B952 Enterococcus as the cause of diseases classified elsewhere: Secondary | ICD-10-CM | POA: Diagnosis not present

## 2016-05-20 DIAGNOSIS — N189 Chronic kidney disease, unspecified: Secondary | ICD-10-CM | POA: Diagnosis not present

## 2016-05-20 DIAGNOSIS — N3000 Acute cystitis without hematuria: Secondary | ICD-10-CM | POA: Diagnosis not present

## 2016-05-20 DIAGNOSIS — I129 Hypertensive chronic kidney disease with stage 1 through stage 4 chronic kidney disease, or unspecified chronic kidney disease: Secondary | ICD-10-CM | POA: Diagnosis not present

## 2016-05-20 DIAGNOSIS — I2510 Atherosclerotic heart disease of native coronary artery without angina pectoris: Secondary | ICD-10-CM | POA: Diagnosis not present

## 2016-05-20 DIAGNOSIS — N183 Chronic kidney disease, stage 3 (moderate): Secondary | ICD-10-CM | POA: Diagnosis not present

## 2016-05-20 DIAGNOSIS — D631 Anemia in chronic kidney disease: Secondary | ICD-10-CM | POA: Diagnosis not present

## 2016-05-20 DIAGNOSIS — R5381 Other malaise: Secondary | ICD-10-CM | POA: Diagnosis not present

## 2016-05-26 DIAGNOSIS — Z0000 Encounter for general adult medical examination without abnormal findings: Secondary | ICD-10-CM | POA: Diagnosis not present

## 2016-05-27 DIAGNOSIS — N3000 Acute cystitis without hematuria: Secondary | ICD-10-CM | POA: Diagnosis not present

## 2016-05-27 DIAGNOSIS — R5381 Other malaise: Secondary | ICD-10-CM | POA: Diagnosis not present

## 2016-05-27 DIAGNOSIS — I2510 Atherosclerotic heart disease of native coronary artery without angina pectoris: Secondary | ICD-10-CM | POA: Diagnosis not present

## 2016-05-27 DIAGNOSIS — B952 Enterococcus as the cause of diseases classified elsewhere: Secondary | ICD-10-CM | POA: Diagnosis not present

## 2016-05-27 DIAGNOSIS — I129 Hypertensive chronic kidney disease with stage 1 through stage 4 chronic kidney disease, or unspecified chronic kidney disease: Secondary | ICD-10-CM | POA: Diagnosis not present

## 2016-05-27 DIAGNOSIS — N183 Chronic kidney disease, stage 3 (moderate): Secondary | ICD-10-CM | POA: Diagnosis not present

## 2016-06-03 DIAGNOSIS — I129 Hypertensive chronic kidney disease with stage 1 through stage 4 chronic kidney disease, or unspecified chronic kidney disease: Secondary | ICD-10-CM | POA: Diagnosis not present

## 2016-06-03 DIAGNOSIS — N183 Chronic kidney disease, stage 3 (moderate): Secondary | ICD-10-CM | POA: Diagnosis not present

## 2016-06-03 DIAGNOSIS — R269 Unspecified abnormalities of gait and mobility: Secondary | ICD-10-CM | POA: Diagnosis not present

## 2016-06-03 DIAGNOSIS — M6281 Muscle weakness (generalized): Secondary | ICD-10-CM | POA: Diagnosis not present

## 2016-06-03 DIAGNOSIS — Z209 Contact with and (suspected) exposure to unspecified communicable disease: Secondary | ICD-10-CM | POA: Diagnosis not present

## 2016-06-03 DIAGNOSIS — F0390 Unspecified dementia without behavioral disturbance: Secondary | ICD-10-CM | POA: Diagnosis not present

## 2016-06-07 DIAGNOSIS — E119 Type 2 diabetes mellitus without complications: Secondary | ICD-10-CM | POA: Diagnosis not present

## 2016-06-07 DIAGNOSIS — E039 Hypothyroidism, unspecified: Secondary | ICD-10-CM | POA: Diagnosis not present

## 2016-06-07 DIAGNOSIS — E559 Vitamin D deficiency, unspecified: Secondary | ICD-10-CM | POA: Diagnosis not present

## 2016-06-07 DIAGNOSIS — D649 Anemia, unspecified: Secondary | ICD-10-CM | POA: Diagnosis not present

## 2016-06-07 DIAGNOSIS — I10 Essential (primary) hypertension: Secondary | ICD-10-CM | POA: Diagnosis not present

## 2016-07-01 DIAGNOSIS — D649 Anemia, unspecified: Secondary | ICD-10-CM | POA: Diagnosis not present

## 2016-07-15 DIAGNOSIS — E0590 Thyrotoxicosis, unspecified without thyrotoxic crisis or storm: Secondary | ICD-10-CM | POA: Diagnosis not present

## 2016-07-15 DIAGNOSIS — I2510 Atherosclerotic heart disease of native coronary artery without angina pectoris: Secondary | ICD-10-CM | POA: Diagnosis not present

## 2016-07-15 DIAGNOSIS — G894 Chronic pain syndrome: Secondary | ICD-10-CM | POA: Diagnosis not present

## 2016-07-15 DIAGNOSIS — I10 Essential (primary) hypertension: Secondary | ICD-10-CM | POA: Diagnosis not present

## 2016-08-16 DIAGNOSIS — M79675 Pain in left toe(s): Secondary | ICD-10-CM | POA: Diagnosis not present

## 2016-08-16 DIAGNOSIS — B351 Tinea unguium: Secondary | ICD-10-CM | POA: Diagnosis not present

## 2016-08-16 DIAGNOSIS — M79674 Pain in right toe(s): Secondary | ICD-10-CM | POA: Diagnosis not present

## 2016-08-16 DIAGNOSIS — I70203 Unspecified atherosclerosis of native arteries of extremities, bilateral legs: Secondary | ICD-10-CM | POA: Diagnosis not present

## 2016-08-31 DIAGNOSIS — N189 Chronic kidney disease, unspecified: Secondary | ICD-10-CM | POA: Diagnosis not present

## 2016-08-31 DIAGNOSIS — D631 Anemia in chronic kidney disease: Secondary | ICD-10-CM | POA: Diagnosis not present

## 2016-09-08 DIAGNOSIS — R269 Unspecified abnormalities of gait and mobility: Secondary | ICD-10-CM | POA: Diagnosis not present

## 2016-09-08 DIAGNOSIS — Z79899 Other long term (current) drug therapy: Secondary | ICD-10-CM | POA: Diagnosis not present

## 2016-09-08 DIAGNOSIS — R0981 Nasal congestion: Secondary | ICD-10-CM | POA: Diagnosis not present

## 2016-09-08 DIAGNOSIS — H6123 Impacted cerumen, bilateral: Secondary | ICD-10-CM | POA: Diagnosis not present

## 2016-09-08 DIAGNOSIS — J342 Deviated nasal septum: Secondary | ICD-10-CM | POA: Diagnosis not present

## 2016-09-08 DIAGNOSIS — H9193 Unspecified hearing loss, bilateral: Secondary | ICD-10-CM | POA: Diagnosis not present

## 2016-09-14 DIAGNOSIS — N183 Chronic kidney disease, stage 3 (moderate): Secondary | ICD-10-CM | POA: Diagnosis not present

## 2016-09-14 DIAGNOSIS — R509 Fever, unspecified: Secondary | ICD-10-CM | POA: Diagnosis not present

## 2016-09-14 DIAGNOSIS — R5381 Other malaise: Secondary | ICD-10-CM | POA: Diagnosis not present

## 2016-09-14 DIAGNOSIS — I2510 Atherosclerotic heart disease of native coronary artery without angina pectoris: Secondary | ICD-10-CM | POA: Diagnosis not present

## 2016-09-14 DIAGNOSIS — F0390 Unspecified dementia without behavioral disturbance: Secondary | ICD-10-CM | POA: Diagnosis not present

## 2016-09-14 DIAGNOSIS — I129 Hypertensive chronic kidney disease with stage 1 through stage 4 chronic kidney disease, or unspecified chronic kidney disease: Secondary | ICD-10-CM | POA: Diagnosis not present

## 2016-09-14 DIAGNOSIS — Z6821 Body mass index (BMI) 21.0-21.9, adult: Secondary | ICD-10-CM | POA: Diagnosis not present

## 2016-10-07 DIAGNOSIS — R54 Age-related physical debility: Secondary | ICD-10-CM | POA: Diagnosis not present

## 2016-10-07 DIAGNOSIS — R269 Unspecified abnormalities of gait and mobility: Secondary | ICD-10-CM | POA: Diagnosis not present

## 2016-10-07 DIAGNOSIS — W19XXXA Unspecified fall, initial encounter: Secondary | ICD-10-CM | POA: Diagnosis not present

## 2016-10-07 DIAGNOSIS — M6281 Muscle weakness (generalized): Secondary | ICD-10-CM | POA: Diagnosis not present

## 2016-10-21 DIAGNOSIS — W19XXXA Unspecified fall, initial encounter: Secondary | ICD-10-CM | POA: Diagnosis not present

## 2016-10-21 DIAGNOSIS — R54 Age-related physical debility: Secondary | ICD-10-CM | POA: Diagnosis not present

## 2016-10-21 DIAGNOSIS — M6281 Muscle weakness (generalized): Secondary | ICD-10-CM | POA: Diagnosis not present

## 2016-10-21 DIAGNOSIS — Z9181 History of falling: Secondary | ICD-10-CM | POA: Diagnosis not present

## 2016-10-21 DIAGNOSIS — R269 Unspecified abnormalities of gait and mobility: Secondary | ICD-10-CM | POA: Diagnosis not present

## 2016-10-25 DIAGNOSIS — R2689 Other abnormalities of gait and mobility: Secondary | ICD-10-CM | POA: Diagnosis not present

## 2016-10-25 DIAGNOSIS — Z7982 Long term (current) use of aspirin: Secondary | ICD-10-CM | POA: Diagnosis not present

## 2016-10-25 DIAGNOSIS — Z9181 History of falling: Secondary | ICD-10-CM | POA: Diagnosis not present

## 2016-10-25 DIAGNOSIS — I129 Hypertensive chronic kidney disease with stage 1 through stage 4 chronic kidney disease, or unspecified chronic kidney disease: Secondary | ICD-10-CM | POA: Diagnosis not present

## 2016-10-25 DIAGNOSIS — M6281 Muscle weakness (generalized): Secondary | ICD-10-CM | POA: Diagnosis not present

## 2016-10-25 DIAGNOSIS — I2510 Atherosclerotic heart disease of native coronary artery without angina pectoris: Secondary | ICD-10-CM | POA: Diagnosis not present

## 2016-10-25 DIAGNOSIS — M1991 Primary osteoarthritis, unspecified site: Secondary | ICD-10-CM | POA: Diagnosis not present

## 2016-10-25 DIAGNOSIS — N183 Chronic kidney disease, stage 3 (moderate): Secondary | ICD-10-CM | POA: Diagnosis not present

## 2016-10-25 DIAGNOSIS — F329 Major depressive disorder, single episode, unspecified: Secondary | ICD-10-CM | POA: Diagnosis not present

## 2016-10-25 DIAGNOSIS — R296 Repeated falls: Secondary | ICD-10-CM | POA: Diagnosis not present

## 2016-10-27 DIAGNOSIS — R296 Repeated falls: Secondary | ICD-10-CM | POA: Diagnosis not present

## 2016-10-27 DIAGNOSIS — M79605 Pain in left leg: Secondary | ICD-10-CM | POA: Diagnosis not present

## 2016-10-27 DIAGNOSIS — W010XXA Fall on same level from slipping, tripping and stumbling without subsequent striking against object, initial encounter: Secondary | ICD-10-CM | POA: Diagnosis not present

## 2016-11-01 DIAGNOSIS — D649 Anemia, unspecified: Secondary | ICD-10-CM | POA: Diagnosis not present

## 2016-11-02 DIAGNOSIS — R2689 Other abnormalities of gait and mobility: Secondary | ICD-10-CM | POA: Diagnosis not present

## 2016-11-02 DIAGNOSIS — I129 Hypertensive chronic kidney disease with stage 1 through stage 4 chronic kidney disease, or unspecified chronic kidney disease: Secondary | ICD-10-CM | POA: Diagnosis not present

## 2016-11-02 DIAGNOSIS — R296 Repeated falls: Secondary | ICD-10-CM | POA: Diagnosis not present

## 2016-11-02 DIAGNOSIS — M6281 Muscle weakness (generalized): Secondary | ICD-10-CM | POA: Diagnosis not present

## 2016-11-02 DIAGNOSIS — M1991 Primary osteoarthritis, unspecified site: Secondary | ICD-10-CM | POA: Diagnosis not present

## 2016-11-02 DIAGNOSIS — N183 Chronic kidney disease, stage 3 (moderate): Secondary | ICD-10-CM | POA: Diagnosis not present

## 2016-11-03 DIAGNOSIS — R2232 Localized swelling, mass and lump, left upper limb: Secondary | ICD-10-CM | POA: Diagnosis not present

## 2016-11-03 DIAGNOSIS — I10 Essential (primary) hypertension: Secondary | ICD-10-CM | POA: Diagnosis not present

## 2016-11-04 DIAGNOSIS — R2689 Other abnormalities of gait and mobility: Secondary | ICD-10-CM | POA: Diagnosis not present

## 2016-11-04 DIAGNOSIS — I129 Hypertensive chronic kidney disease with stage 1 through stage 4 chronic kidney disease, or unspecified chronic kidney disease: Secondary | ICD-10-CM | POA: Diagnosis not present

## 2016-11-04 DIAGNOSIS — N183 Chronic kidney disease, stage 3 (moderate): Secondary | ICD-10-CM | POA: Diagnosis not present

## 2016-11-04 DIAGNOSIS — M1991 Primary osteoarthritis, unspecified site: Secondary | ICD-10-CM | POA: Diagnosis not present

## 2016-11-04 DIAGNOSIS — M6281 Muscle weakness (generalized): Secondary | ICD-10-CM | POA: Diagnosis not present

## 2016-11-04 DIAGNOSIS — R296 Repeated falls: Secondary | ICD-10-CM | POA: Diagnosis not present

## 2016-11-08 DIAGNOSIS — N183 Chronic kidney disease, stage 3 (moderate): Secondary | ICD-10-CM | POA: Diagnosis not present

## 2016-11-08 DIAGNOSIS — M6281 Muscle weakness (generalized): Secondary | ICD-10-CM | POA: Diagnosis not present

## 2016-11-08 DIAGNOSIS — R296 Repeated falls: Secondary | ICD-10-CM | POA: Diagnosis not present

## 2016-11-08 DIAGNOSIS — I129 Hypertensive chronic kidney disease with stage 1 through stage 4 chronic kidney disease, or unspecified chronic kidney disease: Secondary | ICD-10-CM | POA: Diagnosis not present

## 2016-11-08 DIAGNOSIS — R2689 Other abnormalities of gait and mobility: Secondary | ICD-10-CM | POA: Diagnosis not present

## 2016-11-08 DIAGNOSIS — M1991 Primary osteoarthritis, unspecified site: Secondary | ICD-10-CM | POA: Diagnosis not present

## 2016-11-10 DIAGNOSIS — N183 Chronic kidney disease, stage 3 (moderate): Secondary | ICD-10-CM | POA: Diagnosis not present

## 2016-11-10 DIAGNOSIS — R2689 Other abnormalities of gait and mobility: Secondary | ICD-10-CM | POA: Diagnosis not present

## 2016-11-10 DIAGNOSIS — M1991 Primary osteoarthritis, unspecified site: Secondary | ICD-10-CM | POA: Diagnosis not present

## 2016-11-10 DIAGNOSIS — M6281 Muscle weakness (generalized): Secondary | ICD-10-CM | POA: Diagnosis not present

## 2016-11-10 DIAGNOSIS — I129 Hypertensive chronic kidney disease with stage 1 through stage 4 chronic kidney disease, or unspecified chronic kidney disease: Secondary | ICD-10-CM | POA: Diagnosis not present

## 2016-11-10 DIAGNOSIS — R296 Repeated falls: Secondary | ICD-10-CM | POA: Diagnosis not present

## 2016-11-11 DIAGNOSIS — R269 Unspecified abnormalities of gait and mobility: Secondary | ICD-10-CM | POA: Diagnosis not present

## 2016-11-11 DIAGNOSIS — M25552 Pain in left hip: Secondary | ICD-10-CM | POA: Diagnosis not present

## 2016-11-11 DIAGNOSIS — W19XXXA Unspecified fall, initial encounter: Secondary | ICD-10-CM | POA: Diagnosis not present

## 2016-11-11 DIAGNOSIS — M6281 Muscle weakness (generalized): Secondary | ICD-10-CM | POA: Diagnosis not present

## 2016-11-11 DIAGNOSIS — Z9181 History of falling: Secondary | ICD-10-CM | POA: Diagnosis not present

## 2016-11-11 DIAGNOSIS — R296 Repeated falls: Secondary | ICD-10-CM | POA: Diagnosis not present

## 2016-11-11 DIAGNOSIS — I10 Essential (primary) hypertension: Secondary | ICD-10-CM | POA: Diagnosis not present

## 2016-11-16 DIAGNOSIS — N183 Chronic kidney disease, stage 3 (moderate): Secondary | ICD-10-CM | POA: Diagnosis not present

## 2016-11-16 DIAGNOSIS — M1991 Primary osteoarthritis, unspecified site: Secondary | ICD-10-CM | POA: Diagnosis not present

## 2016-11-16 DIAGNOSIS — I129 Hypertensive chronic kidney disease with stage 1 through stage 4 chronic kidney disease, or unspecified chronic kidney disease: Secondary | ICD-10-CM | POA: Diagnosis not present

## 2016-11-16 DIAGNOSIS — M6281 Muscle weakness (generalized): Secondary | ICD-10-CM | POA: Diagnosis not present

## 2016-11-16 DIAGNOSIS — R296 Repeated falls: Secondary | ICD-10-CM | POA: Diagnosis not present

## 2016-11-16 DIAGNOSIS — R2689 Other abnormalities of gait and mobility: Secondary | ICD-10-CM | POA: Diagnosis not present

## 2016-11-19 DIAGNOSIS — R296 Repeated falls: Secondary | ICD-10-CM | POA: Diagnosis not present

## 2016-11-19 DIAGNOSIS — M6281 Muscle weakness (generalized): Secondary | ICD-10-CM | POA: Diagnosis not present

## 2016-11-19 DIAGNOSIS — R2689 Other abnormalities of gait and mobility: Secondary | ICD-10-CM | POA: Diagnosis not present

## 2016-11-19 DIAGNOSIS — N183 Chronic kidney disease, stage 3 (moderate): Secondary | ICD-10-CM | POA: Diagnosis not present

## 2016-11-19 DIAGNOSIS — I129 Hypertensive chronic kidney disease with stage 1 through stage 4 chronic kidney disease, or unspecified chronic kidney disease: Secondary | ICD-10-CM | POA: Diagnosis not present

## 2016-11-19 DIAGNOSIS — M1991 Primary osteoarthritis, unspecified site: Secondary | ICD-10-CM | POA: Diagnosis not present

## 2016-11-23 DIAGNOSIS — R2689 Other abnormalities of gait and mobility: Secondary | ICD-10-CM | POA: Diagnosis not present

## 2016-11-23 DIAGNOSIS — R296 Repeated falls: Secondary | ICD-10-CM | POA: Diagnosis not present

## 2016-11-23 DIAGNOSIS — N183 Chronic kidney disease, stage 3 (moderate): Secondary | ICD-10-CM | POA: Diagnosis not present

## 2016-11-23 DIAGNOSIS — M6281 Muscle weakness (generalized): Secondary | ICD-10-CM | POA: Diagnosis not present

## 2016-11-23 DIAGNOSIS — M1991 Primary osteoarthritis, unspecified site: Secondary | ICD-10-CM | POA: Diagnosis not present

## 2016-11-23 DIAGNOSIS — I129 Hypertensive chronic kidney disease with stage 1 through stage 4 chronic kidney disease, or unspecified chronic kidney disease: Secondary | ICD-10-CM | POA: Diagnosis not present

## 2016-11-25 DIAGNOSIS — I129 Hypertensive chronic kidney disease with stage 1 through stage 4 chronic kidney disease, or unspecified chronic kidney disease: Secondary | ICD-10-CM | POA: Diagnosis not present

## 2016-11-25 DIAGNOSIS — R2689 Other abnormalities of gait and mobility: Secondary | ICD-10-CM | POA: Diagnosis not present

## 2016-11-25 DIAGNOSIS — N183 Chronic kidney disease, stage 3 (moderate): Secondary | ICD-10-CM | POA: Diagnosis not present

## 2016-11-25 DIAGNOSIS — M6281 Muscle weakness (generalized): Secondary | ICD-10-CM | POA: Diagnosis not present

## 2016-11-25 DIAGNOSIS — R296 Repeated falls: Secondary | ICD-10-CM | POA: Diagnosis not present

## 2016-11-25 DIAGNOSIS — M1991 Primary osteoarthritis, unspecified site: Secondary | ICD-10-CM | POA: Diagnosis not present

## 2016-11-30 DIAGNOSIS — R2689 Other abnormalities of gait and mobility: Secondary | ICD-10-CM | POA: Diagnosis not present

## 2016-11-30 DIAGNOSIS — M1991 Primary osteoarthritis, unspecified site: Secondary | ICD-10-CM | POA: Diagnosis not present

## 2016-11-30 DIAGNOSIS — M6281 Muscle weakness (generalized): Secondary | ICD-10-CM | POA: Diagnosis not present

## 2016-11-30 DIAGNOSIS — N183 Chronic kidney disease, stage 3 (moderate): Secondary | ICD-10-CM | POA: Diagnosis not present

## 2016-11-30 DIAGNOSIS — I129 Hypertensive chronic kidney disease with stage 1 through stage 4 chronic kidney disease, or unspecified chronic kidney disease: Secondary | ICD-10-CM | POA: Diagnosis not present

## 2016-11-30 DIAGNOSIS — R296 Repeated falls: Secondary | ICD-10-CM | POA: Diagnosis not present

## 2016-12-03 DIAGNOSIS — M6281 Muscle weakness (generalized): Secondary | ICD-10-CM | POA: Diagnosis not present

## 2016-12-03 DIAGNOSIS — I129 Hypertensive chronic kidney disease with stage 1 through stage 4 chronic kidney disease, or unspecified chronic kidney disease: Secondary | ICD-10-CM | POA: Diagnosis not present

## 2016-12-03 DIAGNOSIS — N183 Chronic kidney disease, stage 3 (moderate): Secondary | ICD-10-CM | POA: Diagnosis not present

## 2016-12-03 DIAGNOSIS — R2689 Other abnormalities of gait and mobility: Secondary | ICD-10-CM | POA: Diagnosis not present

## 2016-12-03 DIAGNOSIS — R296 Repeated falls: Secondary | ICD-10-CM | POA: Diagnosis not present

## 2016-12-03 DIAGNOSIS — M1991 Primary osteoarthritis, unspecified site: Secondary | ICD-10-CM | POA: Diagnosis not present

## 2016-12-07 DIAGNOSIS — R296 Repeated falls: Secondary | ICD-10-CM | POA: Diagnosis not present

## 2016-12-07 DIAGNOSIS — I129 Hypertensive chronic kidney disease with stage 1 through stage 4 chronic kidney disease, or unspecified chronic kidney disease: Secondary | ICD-10-CM | POA: Diagnosis not present

## 2016-12-07 DIAGNOSIS — M6281 Muscle weakness (generalized): Secondary | ICD-10-CM | POA: Diagnosis not present

## 2016-12-07 DIAGNOSIS — M1991 Primary osteoarthritis, unspecified site: Secondary | ICD-10-CM | POA: Diagnosis not present

## 2016-12-07 DIAGNOSIS — R2689 Other abnormalities of gait and mobility: Secondary | ICD-10-CM | POA: Diagnosis not present

## 2016-12-07 DIAGNOSIS — N183 Chronic kidney disease, stage 3 (moderate): Secondary | ICD-10-CM | POA: Diagnosis not present

## 2016-12-08 DIAGNOSIS — G894 Chronic pain syndrome: Secondary | ICD-10-CM | POA: Diagnosis not present

## 2016-12-08 DIAGNOSIS — M25562 Pain in left knee: Secondary | ICD-10-CM | POA: Diagnosis not present

## 2016-12-08 DIAGNOSIS — W19XXXA Unspecified fall, initial encounter: Secondary | ICD-10-CM | POA: Diagnosis not present

## 2016-12-10 DIAGNOSIS — R2689 Other abnormalities of gait and mobility: Secondary | ICD-10-CM | POA: Diagnosis not present

## 2016-12-10 DIAGNOSIS — M1991 Primary osteoarthritis, unspecified site: Secondary | ICD-10-CM | POA: Diagnosis not present

## 2016-12-10 DIAGNOSIS — I129 Hypertensive chronic kidney disease with stage 1 through stage 4 chronic kidney disease, or unspecified chronic kidney disease: Secondary | ICD-10-CM | POA: Diagnosis not present

## 2016-12-10 DIAGNOSIS — N183 Chronic kidney disease, stage 3 (moderate): Secondary | ICD-10-CM | POA: Diagnosis not present

## 2016-12-10 DIAGNOSIS — R296 Repeated falls: Secondary | ICD-10-CM | POA: Diagnosis not present

## 2016-12-10 DIAGNOSIS — M6281 Muscle weakness (generalized): Secondary | ICD-10-CM | POA: Diagnosis not present

## 2016-12-13 DIAGNOSIS — I129 Hypertensive chronic kidney disease with stage 1 through stage 4 chronic kidney disease, or unspecified chronic kidney disease: Secondary | ICD-10-CM | POA: Diagnosis not present

## 2016-12-13 DIAGNOSIS — M6281 Muscle weakness (generalized): Secondary | ICD-10-CM | POA: Diagnosis not present

## 2016-12-13 DIAGNOSIS — R296 Repeated falls: Secondary | ICD-10-CM | POA: Diagnosis not present

## 2016-12-13 DIAGNOSIS — M1991 Primary osteoarthritis, unspecified site: Secondary | ICD-10-CM | POA: Diagnosis not present

## 2016-12-13 DIAGNOSIS — N183 Chronic kidney disease, stage 3 (moderate): Secondary | ICD-10-CM | POA: Diagnosis not present

## 2016-12-13 DIAGNOSIS — R2689 Other abnormalities of gait and mobility: Secondary | ICD-10-CM | POA: Diagnosis not present

## 2016-12-15 DIAGNOSIS — M6281 Muscle weakness (generalized): Secondary | ICD-10-CM | POA: Diagnosis not present

## 2016-12-15 DIAGNOSIS — R296 Repeated falls: Secondary | ICD-10-CM | POA: Diagnosis not present

## 2016-12-15 DIAGNOSIS — N183 Chronic kidney disease, stage 3 (moderate): Secondary | ICD-10-CM | POA: Diagnosis not present

## 2016-12-15 DIAGNOSIS — R2689 Other abnormalities of gait and mobility: Secondary | ICD-10-CM | POA: Diagnosis not present

## 2016-12-15 DIAGNOSIS — M1991 Primary osteoarthritis, unspecified site: Secondary | ICD-10-CM | POA: Diagnosis not present

## 2016-12-15 DIAGNOSIS — I129 Hypertensive chronic kidney disease with stage 1 through stage 4 chronic kidney disease, or unspecified chronic kidney disease: Secondary | ICD-10-CM | POA: Diagnosis not present

## 2017-01-03 DIAGNOSIS — N390 Urinary tract infection, site not specified: Secondary | ICD-10-CM | POA: Diagnosis not present

## 2017-01-06 DIAGNOSIS — N390 Urinary tract infection, site not specified: Secondary | ICD-10-CM | POA: Diagnosis not present

## 2017-01-12 DIAGNOSIS — F329 Major depressive disorder, single episode, unspecified: Secondary | ICD-10-CM | POA: Diagnosis not present

## 2017-01-12 DIAGNOSIS — I10 Essential (primary) hypertension: Secondary | ICD-10-CM | POA: Diagnosis not present

## 2017-01-25 DIAGNOSIS — D631 Anemia in chronic kidney disease: Secondary | ICD-10-CM | POA: Diagnosis not present

## 2017-01-25 DIAGNOSIS — N189 Chronic kidney disease, unspecified: Secondary | ICD-10-CM | POA: Diagnosis not present

## 2017-02-09 DIAGNOSIS — G894 Chronic pain syndrome: Secondary | ICD-10-CM | POA: Diagnosis not present

## 2017-02-09 DIAGNOSIS — I10 Essential (primary) hypertension: Secondary | ICD-10-CM | POA: Diagnosis not present

## 2017-02-09 DIAGNOSIS — M25562 Pain in left knee: Secondary | ICD-10-CM | POA: Diagnosis not present

## 2017-02-09 DIAGNOSIS — M25561 Pain in right knee: Secondary | ICD-10-CM | POA: Diagnosis not present

## 2017-02-09 DIAGNOSIS — D649 Anemia, unspecified: Secondary | ICD-10-CM | POA: Diagnosis not present

## 2017-02-14 DIAGNOSIS — D649 Anemia, unspecified: Secondary | ICD-10-CM | POA: Diagnosis not present

## 2017-02-14 DIAGNOSIS — E039 Hypothyroidism, unspecified: Secondary | ICD-10-CM | POA: Diagnosis not present

## 2017-02-14 DIAGNOSIS — I10 Essential (primary) hypertension: Secondary | ICD-10-CM | POA: Diagnosis not present

## 2017-02-17 DIAGNOSIS — R54 Age-related physical debility: Secondary | ICD-10-CM | POA: Diagnosis not present

## 2017-02-17 DIAGNOSIS — R1030 Lower abdominal pain, unspecified: Secondary | ICD-10-CM | POA: Diagnosis not present

## 2017-02-17 DIAGNOSIS — Z23 Encounter for immunization: Secondary | ICD-10-CM | POA: Diagnosis not present

## 2017-02-17 DIAGNOSIS — R5381 Other malaise: Secondary | ICD-10-CM | POA: Diagnosis not present

## 2017-02-18 DIAGNOSIS — F0390 Unspecified dementia without behavioral disturbance: Secondary | ICD-10-CM | POA: Diagnosis not present

## 2017-02-18 DIAGNOSIS — W19XXXA Unspecified fall, initial encounter: Secondary | ICD-10-CM | POA: Diagnosis not present

## 2017-02-18 DIAGNOSIS — Z9181 History of falling: Secondary | ICD-10-CM | POA: Diagnosis not present

## 2017-02-18 DIAGNOSIS — R269 Unspecified abnormalities of gait and mobility: Secondary | ICD-10-CM | POA: Diagnosis not present

## 2017-02-18 DIAGNOSIS — M6281 Muscle weakness (generalized): Secondary | ICD-10-CM | POA: Diagnosis not present

## 2017-02-22 DIAGNOSIS — R1900 Intra-abdominal and pelvic swelling, mass and lump, unspecified site: Secondary | ICD-10-CM | POA: Diagnosis not present

## 2017-02-25 DIAGNOSIS — D649 Anemia, unspecified: Secondary | ICD-10-CM | POA: Diagnosis not present

## 2017-03-08 DIAGNOSIS — R2689 Other abnormalities of gait and mobility: Secondary | ICD-10-CM | POA: Diagnosis not present

## 2017-03-08 DIAGNOSIS — I2510 Atherosclerotic heart disease of native coronary artery without angina pectoris: Secondary | ICD-10-CM | POA: Diagnosis not present

## 2017-03-08 DIAGNOSIS — R54 Age-related physical debility: Secondary | ICD-10-CM | POA: Diagnosis not present

## 2017-03-08 DIAGNOSIS — R251 Tremor, unspecified: Secondary | ICD-10-CM | POA: Diagnosis not present

## 2017-03-08 DIAGNOSIS — F0390 Unspecified dementia without behavioral disturbance: Secondary | ICD-10-CM | POA: Diagnosis not present

## 2017-03-09 DIAGNOSIS — K219 Gastro-esophageal reflux disease without esophagitis: Secondary | ICD-10-CM | POA: Diagnosis not present

## 2017-03-09 DIAGNOSIS — Z7982 Long term (current) use of aspirin: Secondary | ICD-10-CM | POA: Diagnosis not present

## 2017-03-09 DIAGNOSIS — R2689 Other abnormalities of gait and mobility: Secondary | ICD-10-CM | POA: Diagnosis not present

## 2017-03-09 DIAGNOSIS — G894 Chronic pain syndrome: Secondary | ICD-10-CM | POA: Diagnosis not present

## 2017-03-09 DIAGNOSIS — M159 Polyosteoarthritis, unspecified: Secondary | ICD-10-CM | POA: Diagnosis not present

## 2017-03-09 DIAGNOSIS — I129 Hypertensive chronic kidney disease with stage 1 through stage 4 chronic kidney disease, or unspecified chronic kidney disease: Secondary | ICD-10-CM | POA: Diagnosis not present

## 2017-03-09 DIAGNOSIS — F329 Major depressive disorder, single episode, unspecified: Secondary | ICD-10-CM | POA: Diagnosis not present

## 2017-03-09 DIAGNOSIS — F0390 Unspecified dementia without behavioral disturbance: Secondary | ICD-10-CM | POA: Diagnosis not present

## 2017-03-09 DIAGNOSIS — M6281 Muscle weakness (generalized): Secondary | ICD-10-CM | POA: Diagnosis not present

## 2017-03-09 DIAGNOSIS — N183 Chronic kidney disease, stage 3 (moderate): Secondary | ICD-10-CM | POA: Diagnosis not present

## 2017-03-09 DIAGNOSIS — I2510 Atherosclerotic heart disease of native coronary artery without angina pectoris: Secondary | ICD-10-CM | POA: Diagnosis not present

## 2017-03-09 DIAGNOSIS — Z9181 History of falling: Secondary | ICD-10-CM | POA: Diagnosis not present

## 2017-03-11 DIAGNOSIS — D649 Anemia, unspecified: Secondary | ICD-10-CM | POA: Diagnosis not present

## 2017-03-15 DIAGNOSIS — M159 Polyosteoarthritis, unspecified: Secondary | ICD-10-CM | POA: Diagnosis not present

## 2017-03-15 DIAGNOSIS — M6281 Muscle weakness (generalized): Secondary | ICD-10-CM | POA: Diagnosis not present

## 2017-03-15 DIAGNOSIS — Z9181 History of falling: Secondary | ICD-10-CM | POA: Diagnosis not present

## 2017-03-15 DIAGNOSIS — N183 Chronic kidney disease, stage 3 (moderate): Secondary | ICD-10-CM | POA: Diagnosis not present

## 2017-03-15 DIAGNOSIS — I129 Hypertensive chronic kidney disease with stage 1 through stage 4 chronic kidney disease, or unspecified chronic kidney disease: Secondary | ICD-10-CM | POA: Diagnosis not present

## 2017-03-15 DIAGNOSIS — R2689 Other abnormalities of gait and mobility: Secondary | ICD-10-CM | POA: Diagnosis not present

## 2017-03-22 DIAGNOSIS — N183 Chronic kidney disease, stage 3 (moderate): Secondary | ICD-10-CM | POA: Diagnosis not present

## 2017-03-22 DIAGNOSIS — R2689 Other abnormalities of gait and mobility: Secondary | ICD-10-CM | POA: Diagnosis not present

## 2017-03-22 DIAGNOSIS — Z9181 History of falling: Secondary | ICD-10-CM | POA: Diagnosis not present

## 2017-03-22 DIAGNOSIS — I129 Hypertensive chronic kidney disease with stage 1 through stage 4 chronic kidney disease, or unspecified chronic kidney disease: Secondary | ICD-10-CM | POA: Diagnosis not present

## 2017-03-22 DIAGNOSIS — M159 Polyosteoarthritis, unspecified: Secondary | ICD-10-CM | POA: Diagnosis not present

## 2017-03-22 DIAGNOSIS — M6281 Muscle weakness (generalized): Secondary | ICD-10-CM | POA: Diagnosis not present

## 2017-03-24 DIAGNOSIS — N183 Chronic kidney disease, stage 3 (moderate): Secondary | ICD-10-CM | POA: Diagnosis not present

## 2017-03-24 DIAGNOSIS — M6281 Muscle weakness (generalized): Secondary | ICD-10-CM | POA: Diagnosis not present

## 2017-03-24 DIAGNOSIS — Z9181 History of falling: Secondary | ICD-10-CM | POA: Diagnosis not present

## 2017-03-24 DIAGNOSIS — M159 Polyosteoarthritis, unspecified: Secondary | ICD-10-CM | POA: Diagnosis not present

## 2017-03-24 DIAGNOSIS — I129 Hypertensive chronic kidney disease with stage 1 through stage 4 chronic kidney disease, or unspecified chronic kidney disease: Secondary | ICD-10-CM | POA: Diagnosis not present

## 2017-03-24 DIAGNOSIS — R2689 Other abnormalities of gait and mobility: Secondary | ICD-10-CM | POA: Diagnosis not present

## 2017-03-28 DIAGNOSIS — R2689 Other abnormalities of gait and mobility: Secondary | ICD-10-CM | POA: Diagnosis not present

## 2017-03-28 DIAGNOSIS — N183 Chronic kidney disease, stage 3 (moderate): Secondary | ICD-10-CM | POA: Diagnosis not present

## 2017-03-28 DIAGNOSIS — M159 Polyosteoarthritis, unspecified: Secondary | ICD-10-CM | POA: Diagnosis not present

## 2017-03-28 DIAGNOSIS — M6281 Muscle weakness (generalized): Secondary | ICD-10-CM | POA: Diagnosis not present

## 2017-03-28 DIAGNOSIS — I129 Hypertensive chronic kidney disease with stage 1 through stage 4 chronic kidney disease, or unspecified chronic kidney disease: Secondary | ICD-10-CM | POA: Diagnosis not present

## 2017-03-28 DIAGNOSIS — Z9181 History of falling: Secondary | ICD-10-CM | POA: Diagnosis not present

## 2017-03-29 DIAGNOSIS — R54 Age-related physical debility: Secondary | ICD-10-CM | POA: Diagnosis not present

## 2017-03-29 DIAGNOSIS — R05 Cough: Secondary | ICD-10-CM | POA: Diagnosis not present

## 2017-03-31 DIAGNOSIS — R2689 Other abnormalities of gait and mobility: Secondary | ICD-10-CM | POA: Diagnosis not present

## 2017-03-31 DIAGNOSIS — M159 Polyosteoarthritis, unspecified: Secondary | ICD-10-CM | POA: Diagnosis not present

## 2017-03-31 DIAGNOSIS — N183 Chronic kidney disease, stage 3 (moderate): Secondary | ICD-10-CM | POA: Diagnosis not present

## 2017-03-31 DIAGNOSIS — M6281 Muscle weakness (generalized): Secondary | ICD-10-CM | POA: Diagnosis not present

## 2017-03-31 DIAGNOSIS — I129 Hypertensive chronic kidney disease with stage 1 through stage 4 chronic kidney disease, or unspecified chronic kidney disease: Secondary | ICD-10-CM | POA: Diagnosis not present

## 2017-03-31 DIAGNOSIS — Z9181 History of falling: Secondary | ICD-10-CM | POA: Diagnosis not present

## 2017-04-03 DIAGNOSIS — Z043 Encounter for examination and observation following other accident: Secondary | ICD-10-CM | POA: Diagnosis not present

## 2017-04-03 DIAGNOSIS — M25562 Pain in left knee: Secondary | ICD-10-CM | POA: Diagnosis not present

## 2017-04-04 DIAGNOSIS — S20219A Contusion of unspecified front wall of thorax, initial encounter: Secondary | ICD-10-CM | POA: Diagnosis not present

## 2017-04-04 DIAGNOSIS — M79606 Pain in leg, unspecified: Secondary | ICD-10-CM | POA: Diagnosis not present

## 2017-04-04 DIAGNOSIS — R269 Unspecified abnormalities of gait and mobility: Secondary | ICD-10-CM | POA: Diagnosis not present

## 2017-04-04 DIAGNOSIS — W19XXXA Unspecified fall, initial encounter: Secondary | ICD-10-CM | POA: Diagnosis not present

## 2017-04-04 DIAGNOSIS — R54 Age-related physical debility: Secondary | ICD-10-CM | POA: Diagnosis not present

## 2017-04-04 DIAGNOSIS — M6281 Muscle weakness (generalized): Secondary | ICD-10-CM | POA: Diagnosis not present

## 2017-04-05 DIAGNOSIS — N183 Chronic kidney disease, stage 3 (moderate): Secondary | ICD-10-CM | POA: Diagnosis not present

## 2017-04-05 DIAGNOSIS — I129 Hypertensive chronic kidney disease with stage 1 through stage 4 chronic kidney disease, or unspecified chronic kidney disease: Secondary | ICD-10-CM | POA: Diagnosis not present

## 2017-04-05 DIAGNOSIS — R2689 Other abnormalities of gait and mobility: Secondary | ICD-10-CM | POA: Diagnosis not present

## 2017-04-05 DIAGNOSIS — M159 Polyosteoarthritis, unspecified: Secondary | ICD-10-CM | POA: Diagnosis not present

## 2017-04-05 DIAGNOSIS — M6281 Muscle weakness (generalized): Secondary | ICD-10-CM | POA: Diagnosis not present

## 2017-04-05 DIAGNOSIS — Z9181 History of falling: Secondary | ICD-10-CM | POA: Diagnosis not present

## 2017-04-08 DIAGNOSIS — M159 Polyosteoarthritis, unspecified: Secondary | ICD-10-CM | POA: Diagnosis not present

## 2017-04-08 DIAGNOSIS — M6281 Muscle weakness (generalized): Secondary | ICD-10-CM | POA: Diagnosis not present

## 2017-04-08 DIAGNOSIS — Z9181 History of falling: Secondary | ICD-10-CM | POA: Diagnosis not present

## 2017-04-08 DIAGNOSIS — I129 Hypertensive chronic kidney disease with stage 1 through stage 4 chronic kidney disease, or unspecified chronic kidney disease: Secondary | ICD-10-CM | POA: Diagnosis not present

## 2017-04-08 DIAGNOSIS — N183 Chronic kidney disease, stage 3 (moderate): Secondary | ICD-10-CM | POA: Diagnosis not present

## 2017-04-08 DIAGNOSIS — R2689 Other abnormalities of gait and mobility: Secondary | ICD-10-CM | POA: Diagnosis not present

## 2017-04-11 DIAGNOSIS — R0981 Nasal congestion: Secondary | ICD-10-CM | POA: Diagnosis not present

## 2017-04-11 DIAGNOSIS — R05 Cough: Secondary | ICD-10-CM | POA: Diagnosis not present

## 2017-04-11 DIAGNOSIS — F0390 Unspecified dementia without behavioral disturbance: Secondary | ICD-10-CM | POA: Diagnosis not present

## 2017-04-12 DIAGNOSIS — L22 Diaper dermatitis: Secondary | ICD-10-CM | POA: Diagnosis not present

## 2017-04-12 DIAGNOSIS — R54 Age-related physical debility: Secondary | ICD-10-CM | POA: Diagnosis not present

## 2017-04-12 DIAGNOSIS — R2689 Other abnormalities of gait and mobility: Secondary | ICD-10-CM | POA: Diagnosis not present

## 2017-04-12 DIAGNOSIS — N183 Chronic kidney disease, stage 3 (moderate): Secondary | ICD-10-CM | POA: Diagnosis not present

## 2017-04-12 DIAGNOSIS — I129 Hypertensive chronic kidney disease with stage 1 through stage 4 chronic kidney disease, or unspecified chronic kidney disease: Secondary | ICD-10-CM | POA: Diagnosis not present

## 2017-04-12 DIAGNOSIS — Z9181 History of falling: Secondary | ICD-10-CM | POA: Diagnosis not present

## 2017-04-12 DIAGNOSIS — M6281 Muscle weakness (generalized): Secondary | ICD-10-CM | POA: Diagnosis not present

## 2017-04-12 DIAGNOSIS — R269 Unspecified abnormalities of gait and mobility: Secondary | ICD-10-CM | POA: Diagnosis not present

## 2017-04-12 DIAGNOSIS — M159 Polyosteoarthritis, unspecified: Secondary | ICD-10-CM | POA: Diagnosis not present

## 2017-04-12 DIAGNOSIS — F0390 Unspecified dementia without behavioral disturbance: Secondary | ICD-10-CM | POA: Diagnosis not present

## 2017-04-20 DIAGNOSIS — R2689 Other abnormalities of gait and mobility: Secondary | ICD-10-CM | POA: Diagnosis not present

## 2017-04-20 DIAGNOSIS — N183 Chronic kidney disease, stage 3 (moderate): Secondary | ICD-10-CM | POA: Diagnosis not present

## 2017-04-20 DIAGNOSIS — Z9181 History of falling: Secondary | ICD-10-CM | POA: Diagnosis not present

## 2017-04-20 DIAGNOSIS — I129 Hypertensive chronic kidney disease with stage 1 through stage 4 chronic kidney disease, or unspecified chronic kidney disease: Secondary | ICD-10-CM | POA: Diagnosis not present

## 2017-04-20 DIAGNOSIS — M6281 Muscle weakness (generalized): Secondary | ICD-10-CM | POA: Diagnosis not present

## 2017-04-20 DIAGNOSIS — M159 Polyosteoarthritis, unspecified: Secondary | ICD-10-CM | POA: Diagnosis not present

## 2017-04-21 DIAGNOSIS — Z9181 History of falling: Secondary | ICD-10-CM | POA: Diagnosis not present

## 2017-04-21 DIAGNOSIS — M159 Polyosteoarthritis, unspecified: Secondary | ICD-10-CM | POA: Diagnosis not present

## 2017-04-21 DIAGNOSIS — I129 Hypertensive chronic kidney disease with stage 1 through stage 4 chronic kidney disease, or unspecified chronic kidney disease: Secondary | ICD-10-CM | POA: Diagnosis not present

## 2017-04-21 DIAGNOSIS — R2689 Other abnormalities of gait and mobility: Secondary | ICD-10-CM | POA: Diagnosis not present

## 2017-04-21 DIAGNOSIS — N183 Chronic kidney disease, stage 3 (moderate): Secondary | ICD-10-CM | POA: Diagnosis not present

## 2017-04-21 DIAGNOSIS — M6281 Muscle weakness (generalized): Secondary | ICD-10-CM | POA: Diagnosis not present

## 2017-04-25 DIAGNOSIS — R5381 Other malaise: Secondary | ICD-10-CM | POA: Diagnosis not present

## 2017-04-25 DIAGNOSIS — R0989 Other specified symptoms and signs involving the circulatory and respiratory systems: Secondary | ICD-10-CM | POA: Diagnosis not present

## 2017-04-25 DIAGNOSIS — R05 Cough: Secondary | ICD-10-CM | POA: Diagnosis not present

## 2017-04-25 DIAGNOSIS — R040 Epistaxis: Secondary | ICD-10-CM | POA: Diagnosis not present

## 2017-04-25 DIAGNOSIS — R54 Age-related physical debility: Secondary | ICD-10-CM | POA: Diagnosis not present

## 2017-04-25 DIAGNOSIS — I10 Essential (primary) hypertension: Secondary | ICD-10-CM | POA: Diagnosis not present

## 2017-04-26 DIAGNOSIS — R2689 Other abnormalities of gait and mobility: Secondary | ICD-10-CM | POA: Diagnosis not present

## 2017-04-26 DIAGNOSIS — Z9181 History of falling: Secondary | ICD-10-CM | POA: Diagnosis not present

## 2017-04-26 DIAGNOSIS — N183 Chronic kidney disease, stage 3 (moderate): Secondary | ICD-10-CM | POA: Diagnosis not present

## 2017-04-26 DIAGNOSIS — M159 Polyosteoarthritis, unspecified: Secondary | ICD-10-CM | POA: Diagnosis not present

## 2017-04-26 DIAGNOSIS — I129 Hypertensive chronic kidney disease with stage 1 through stage 4 chronic kidney disease, or unspecified chronic kidney disease: Secondary | ICD-10-CM | POA: Diagnosis not present

## 2017-04-26 DIAGNOSIS — M6281 Muscle weakness (generalized): Secondary | ICD-10-CM | POA: Diagnosis not present

## 2017-04-28 DIAGNOSIS — M6281 Muscle weakness (generalized): Secondary | ICD-10-CM | POA: Diagnosis not present

## 2017-04-28 DIAGNOSIS — R2689 Other abnormalities of gait and mobility: Secondary | ICD-10-CM | POA: Diagnosis not present

## 2017-04-28 DIAGNOSIS — N183 Chronic kidney disease, stage 3 (moderate): Secondary | ICD-10-CM | POA: Diagnosis not present

## 2017-04-28 DIAGNOSIS — Z9181 History of falling: Secondary | ICD-10-CM | POA: Diagnosis not present

## 2017-04-28 DIAGNOSIS — M159 Polyosteoarthritis, unspecified: Secondary | ICD-10-CM | POA: Diagnosis not present

## 2017-04-28 DIAGNOSIS — I129 Hypertensive chronic kidney disease with stage 1 through stage 4 chronic kidney disease, or unspecified chronic kidney disease: Secondary | ICD-10-CM | POA: Diagnosis not present

## 2017-05-04 DIAGNOSIS — Z9181 History of falling: Secondary | ICD-10-CM | POA: Diagnosis not present

## 2017-05-04 DIAGNOSIS — M6281 Muscle weakness (generalized): Secondary | ICD-10-CM | POA: Diagnosis not present

## 2017-05-04 DIAGNOSIS — M159 Polyosteoarthritis, unspecified: Secondary | ICD-10-CM | POA: Diagnosis not present

## 2017-05-04 DIAGNOSIS — R2689 Other abnormalities of gait and mobility: Secondary | ICD-10-CM | POA: Diagnosis not present

## 2017-05-04 DIAGNOSIS — I129 Hypertensive chronic kidney disease with stage 1 through stage 4 chronic kidney disease, or unspecified chronic kidney disease: Secondary | ICD-10-CM | POA: Diagnosis not present

## 2017-05-04 DIAGNOSIS — N183 Chronic kidney disease, stage 3 (moderate): Secondary | ICD-10-CM | POA: Diagnosis not present

## 2017-05-06 DIAGNOSIS — I129 Hypertensive chronic kidney disease with stage 1 through stage 4 chronic kidney disease, or unspecified chronic kidney disease: Secondary | ICD-10-CM | POA: Diagnosis not present

## 2017-05-06 DIAGNOSIS — R2689 Other abnormalities of gait and mobility: Secondary | ICD-10-CM | POA: Diagnosis not present

## 2017-05-06 DIAGNOSIS — N183 Chronic kidney disease, stage 3 (moderate): Secondary | ICD-10-CM | POA: Diagnosis not present

## 2017-05-06 DIAGNOSIS — M6281 Muscle weakness (generalized): Secondary | ICD-10-CM | POA: Diagnosis not present

## 2017-05-06 DIAGNOSIS — Z9181 History of falling: Secondary | ICD-10-CM | POA: Diagnosis not present

## 2017-05-06 DIAGNOSIS — M159 Polyosteoarthritis, unspecified: Secondary | ICD-10-CM | POA: Diagnosis not present

## 2017-05-20 DIAGNOSIS — G894 Chronic pain syndrome: Secondary | ICD-10-CM | POA: Diagnosis not present

## 2017-05-20 DIAGNOSIS — N183 Chronic kidney disease, stage 3 (moderate): Secondary | ICD-10-CM | POA: Diagnosis not present

## 2017-05-20 DIAGNOSIS — D649 Anemia, unspecified: Secondary | ICD-10-CM | POA: Diagnosis not present

## 2017-05-20 DIAGNOSIS — F0390 Unspecified dementia without behavioral disturbance: Secondary | ICD-10-CM | POA: Diagnosis not present

## 2017-05-20 DIAGNOSIS — I129 Hypertensive chronic kidney disease with stage 1 through stage 4 chronic kidney disease, or unspecified chronic kidney disease: Secondary | ICD-10-CM | POA: Diagnosis not present

## 2017-05-25 DIAGNOSIS — F0390 Unspecified dementia without behavioral disturbance: Secondary | ICD-10-CM | POA: Diagnosis not present

## 2017-05-25 DIAGNOSIS — I10 Essential (primary) hypertension: Secondary | ICD-10-CM | POA: Diagnosis not present

## 2017-05-25 DIAGNOSIS — D649 Anemia, unspecified: Secondary | ICD-10-CM | POA: Diagnosis not present

## 2017-05-25 DIAGNOSIS — G894 Chronic pain syndrome: Secondary | ICD-10-CM | POA: Diagnosis not present

## 2017-05-25 DIAGNOSIS — E559 Vitamin D deficiency, unspecified: Secondary | ICD-10-CM | POA: Diagnosis not present

## 2017-05-25 DIAGNOSIS — F331 Major depressive disorder, recurrent, moderate: Secondary | ICD-10-CM | POA: Diagnosis not present

## 2017-06-06 DIAGNOSIS — R05 Cough: Secondary | ICD-10-CM | POA: Diagnosis not present

## 2017-06-06 DIAGNOSIS — R0989 Other specified symptoms and signs involving the circulatory and respiratory systems: Secondary | ICD-10-CM | POA: Diagnosis not present

## 2017-06-06 DIAGNOSIS — W19XXXA Unspecified fall, initial encounter: Secondary | ICD-10-CM | POA: Diagnosis not present

## 2017-06-06 DIAGNOSIS — R54 Age-related physical debility: Secondary | ICD-10-CM | POA: Diagnosis not present

## 2017-06-06 DIAGNOSIS — R269 Unspecified abnormalities of gait and mobility: Secondary | ICD-10-CM | POA: Diagnosis not present

## 2017-06-06 DIAGNOSIS — R634 Abnormal weight loss: Secondary | ICD-10-CM | POA: Diagnosis not present

## 2017-06-06 DIAGNOSIS — M6281 Muscle weakness (generalized): Secondary | ICD-10-CM | POA: Diagnosis not present

## 2017-06-06 DIAGNOSIS — R5381 Other malaise: Secondary | ICD-10-CM | POA: Diagnosis not present

## 2017-06-06 DIAGNOSIS — F0390 Unspecified dementia without behavioral disturbance: Secondary | ICD-10-CM | POA: Diagnosis not present

## 2017-06-14 DIAGNOSIS — D649 Anemia, unspecified: Secondary | ICD-10-CM | POA: Diagnosis not present

## 2017-07-15 DIAGNOSIS — D649 Anemia, unspecified: Secondary | ICD-10-CM | POA: Diagnosis not present

## 2017-08-12 DIAGNOSIS — N189 Chronic kidney disease, unspecified: Secondary | ICD-10-CM | POA: Diagnosis not present

## 2017-08-16 DIAGNOSIS — R197 Diarrhea, unspecified: Secondary | ICD-10-CM | POA: Diagnosis not present

## 2017-08-16 DIAGNOSIS — R509 Fever, unspecified: Secondary | ICD-10-CM | POA: Diagnosis not present

## 2017-08-16 DIAGNOSIS — F0390 Unspecified dementia without behavioral disturbance: Secondary | ICD-10-CM | POA: Diagnosis not present

## 2017-08-16 DIAGNOSIS — R5381 Other malaise: Secondary | ICD-10-CM | POA: Diagnosis not present

## 2017-08-16 DIAGNOSIS — R54 Age-related physical debility: Secondary | ICD-10-CM | POA: Diagnosis not present

## 2017-08-23 DIAGNOSIS — W19XXXA Unspecified fall, initial encounter: Secondary | ICD-10-CM | POA: Diagnosis not present

## 2017-08-23 DIAGNOSIS — Z9181 History of falling: Secondary | ICD-10-CM | POA: Diagnosis not present

## 2017-08-23 DIAGNOSIS — R54 Age-related physical debility: Secondary | ICD-10-CM | POA: Diagnosis not present

## 2017-08-23 DIAGNOSIS — M6281 Muscle weakness (generalized): Secondary | ICD-10-CM | POA: Diagnosis not present

## 2017-08-23 DIAGNOSIS — F0390 Unspecified dementia without behavioral disturbance: Secondary | ICD-10-CM | POA: Diagnosis not present

## 2017-08-23 DIAGNOSIS — R269 Unspecified abnormalities of gait and mobility: Secondary | ICD-10-CM | POA: Diagnosis not present

## 2017-08-24 DIAGNOSIS — E0590 Thyrotoxicosis, unspecified without thyrotoxic crisis or storm: Secondary | ICD-10-CM | POA: Diagnosis not present

## 2017-08-24 DIAGNOSIS — G894 Chronic pain syndrome: Secondary | ICD-10-CM | POA: Diagnosis not present

## 2017-08-24 DIAGNOSIS — I10 Essential (primary) hypertension: Secondary | ICD-10-CM | POA: Diagnosis not present

## 2017-08-24 DIAGNOSIS — I2510 Atherosclerotic heart disease of native coronary artery without angina pectoris: Secondary | ICD-10-CM | POA: Diagnosis not present

## 2017-08-24 DIAGNOSIS — M1711 Unilateral primary osteoarthritis, right knee: Secondary | ICD-10-CM | POA: Diagnosis not present

## 2017-08-24 DIAGNOSIS — F331 Major depressive disorder, recurrent, moderate: Secondary | ICD-10-CM | POA: Diagnosis not present

## 2017-08-24 DIAGNOSIS — M1712 Unilateral primary osteoarthritis, left knee: Secondary | ICD-10-CM | POA: Diagnosis not present

## 2017-08-25 DIAGNOSIS — I10 Essential (primary) hypertension: Secondary | ICD-10-CM | POA: Diagnosis not present

## 2017-08-30 DIAGNOSIS — R600 Localized edema: Secondary | ICD-10-CM | POA: Diagnosis not present

## 2017-08-30 DIAGNOSIS — L97509 Non-pressure chronic ulcer of other part of unspecified foot with unspecified severity: Secondary | ICD-10-CM | POA: Diagnosis not present

## 2017-08-30 DIAGNOSIS — M6281 Muscle weakness (generalized): Secondary | ICD-10-CM | POA: Diagnosis not present

## 2017-08-30 DIAGNOSIS — F0390 Unspecified dementia without behavioral disturbance: Secondary | ICD-10-CM | POA: Diagnosis not present

## 2017-08-30 DIAGNOSIS — M79672 Pain in left foot: Secondary | ICD-10-CM | POA: Diagnosis not present

## 2017-08-30 DIAGNOSIS — R269 Unspecified abnormalities of gait and mobility: Secondary | ICD-10-CM | POA: Diagnosis not present

## 2017-09-14 DIAGNOSIS — L97509 Non-pressure chronic ulcer of other part of unspecified foot with unspecified severity: Secondary | ICD-10-CM | POA: Diagnosis not present

## 2017-09-14 DIAGNOSIS — M79672 Pain in left foot: Secondary | ICD-10-CM | POA: Diagnosis not present

## 2017-09-16 DIAGNOSIS — N179 Acute kidney failure, unspecified: Secondary | ICD-10-CM | POA: Diagnosis not present

## 2017-09-16 DIAGNOSIS — D631 Anemia in chronic kidney disease: Secondary | ICD-10-CM | POA: Diagnosis not present

## 2017-10-06 DIAGNOSIS — F0390 Unspecified dementia without behavioral disturbance: Secondary | ICD-10-CM | POA: Diagnosis not present

## 2017-10-06 DIAGNOSIS — R269 Unspecified abnormalities of gait and mobility: Secondary | ICD-10-CM | POA: Diagnosis not present

## 2017-10-06 DIAGNOSIS — L84 Corns and callosities: Secondary | ICD-10-CM | POA: Diagnosis not present

## 2017-10-13 DIAGNOSIS — D631 Anemia in chronic kidney disease: Secondary | ICD-10-CM | POA: Diagnosis not present

## 2017-10-25 DIAGNOSIS — Z7689 Persons encountering health services in other specified circumstances: Secondary | ICD-10-CM | POA: Diagnosis not present

## 2017-10-25 DIAGNOSIS — M6281 Muscle weakness (generalized): Secondary | ICD-10-CM | POA: Diagnosis not present

## 2017-10-25 DIAGNOSIS — R269 Unspecified abnormalities of gait and mobility: Secondary | ICD-10-CM | POA: Diagnosis not present

## 2017-10-25 DIAGNOSIS — W19XXXA Unspecified fall, initial encounter: Secondary | ICD-10-CM | POA: Diagnosis not present

## 2017-10-25 DIAGNOSIS — R54 Age-related physical debility: Secondary | ICD-10-CM | POA: Diagnosis not present

## 2017-10-25 DIAGNOSIS — F0390 Unspecified dementia without behavioral disturbance: Secondary | ICD-10-CM | POA: Diagnosis not present

## 2017-10-27 DIAGNOSIS — M159 Polyosteoarthritis, unspecified: Secondary | ICD-10-CM | POA: Diagnosis not present

## 2017-10-27 DIAGNOSIS — Z7982 Long term (current) use of aspirin: Secondary | ICD-10-CM | POA: Diagnosis not present

## 2017-10-27 DIAGNOSIS — N183 Chronic kidney disease, stage 3 (moderate): Secondary | ICD-10-CM | POA: Diagnosis not present

## 2017-10-27 DIAGNOSIS — I129 Hypertensive chronic kidney disease with stage 1 through stage 4 chronic kidney disease, or unspecified chronic kidney disease: Secondary | ICD-10-CM | POA: Diagnosis not present

## 2017-10-27 DIAGNOSIS — F0390 Unspecified dementia without behavioral disturbance: Secondary | ICD-10-CM | POA: Diagnosis not present

## 2017-10-27 DIAGNOSIS — F329 Major depressive disorder, single episode, unspecified: Secondary | ICD-10-CM | POA: Diagnosis not present

## 2017-10-27 DIAGNOSIS — R296 Repeated falls: Secondary | ICD-10-CM | POA: Diagnosis not present

## 2017-10-27 DIAGNOSIS — G894 Chronic pain syndrome: Secondary | ICD-10-CM | POA: Diagnosis not present

## 2017-10-27 DIAGNOSIS — I2510 Atherosclerotic heart disease of native coronary artery without angina pectoris: Secondary | ICD-10-CM | POA: Diagnosis not present

## 2017-10-27 DIAGNOSIS — M6281 Muscle weakness (generalized): Secondary | ICD-10-CM | POA: Diagnosis not present

## 2017-10-27 DIAGNOSIS — Z9181 History of falling: Secondary | ICD-10-CM | POA: Diagnosis not present

## 2017-11-01 DIAGNOSIS — R296 Repeated falls: Secondary | ICD-10-CM | POA: Diagnosis not present

## 2017-11-01 DIAGNOSIS — I2510 Atherosclerotic heart disease of native coronary artery without angina pectoris: Secondary | ICD-10-CM | POA: Diagnosis not present

## 2017-11-01 DIAGNOSIS — N183 Chronic kidney disease, stage 3 (moderate): Secondary | ICD-10-CM | POA: Diagnosis not present

## 2017-11-01 DIAGNOSIS — I129 Hypertensive chronic kidney disease with stage 1 through stage 4 chronic kidney disease, or unspecified chronic kidney disease: Secondary | ICD-10-CM | POA: Diagnosis not present

## 2017-11-01 DIAGNOSIS — M159 Polyosteoarthritis, unspecified: Secondary | ICD-10-CM | POA: Diagnosis not present

## 2017-11-01 DIAGNOSIS — M6281 Muscle weakness (generalized): Secondary | ICD-10-CM | POA: Diagnosis not present

## 2017-11-02 DIAGNOSIS — I2510 Atherosclerotic heart disease of native coronary artery without angina pectoris: Secondary | ICD-10-CM | POA: Diagnosis not present

## 2017-11-02 DIAGNOSIS — R296 Repeated falls: Secondary | ICD-10-CM | POA: Diagnosis not present

## 2017-11-02 DIAGNOSIS — N183 Chronic kidney disease, stage 3 (moderate): Secondary | ICD-10-CM | POA: Diagnosis not present

## 2017-11-02 DIAGNOSIS — I129 Hypertensive chronic kidney disease with stage 1 through stage 4 chronic kidney disease, or unspecified chronic kidney disease: Secondary | ICD-10-CM | POA: Diagnosis not present

## 2017-11-02 DIAGNOSIS — M6281 Muscle weakness (generalized): Secondary | ICD-10-CM | POA: Diagnosis not present

## 2017-11-02 DIAGNOSIS — M159 Polyosteoarthritis, unspecified: Secondary | ICD-10-CM | POA: Diagnosis not present

## 2017-11-07 DIAGNOSIS — M6281 Muscle weakness (generalized): Secondary | ICD-10-CM | POA: Diagnosis not present

## 2017-11-07 DIAGNOSIS — I2510 Atherosclerotic heart disease of native coronary artery without angina pectoris: Secondary | ICD-10-CM | POA: Diagnosis not present

## 2017-11-07 DIAGNOSIS — I129 Hypertensive chronic kidney disease with stage 1 through stage 4 chronic kidney disease, or unspecified chronic kidney disease: Secondary | ICD-10-CM | POA: Diagnosis not present

## 2017-11-07 DIAGNOSIS — M159 Polyosteoarthritis, unspecified: Secondary | ICD-10-CM | POA: Diagnosis not present

## 2017-11-07 DIAGNOSIS — N183 Chronic kidney disease, stage 3 (moderate): Secondary | ICD-10-CM | POA: Diagnosis not present

## 2017-11-07 DIAGNOSIS — R296 Repeated falls: Secondary | ICD-10-CM | POA: Diagnosis not present

## 2017-11-10 DIAGNOSIS — D631 Anemia in chronic kidney disease: Secondary | ICD-10-CM | POA: Diagnosis not present

## 2017-11-10 DIAGNOSIS — R296 Repeated falls: Secondary | ICD-10-CM | POA: Diagnosis not present

## 2017-11-10 DIAGNOSIS — N183 Chronic kidney disease, stage 3 (moderate): Secondary | ICD-10-CM | POA: Diagnosis not present

## 2017-11-10 DIAGNOSIS — I129 Hypertensive chronic kidney disease with stage 1 through stage 4 chronic kidney disease, or unspecified chronic kidney disease: Secondary | ICD-10-CM | POA: Diagnosis not present

## 2017-11-10 DIAGNOSIS — I2510 Atherosclerotic heart disease of native coronary artery without angina pectoris: Secondary | ICD-10-CM | POA: Diagnosis not present

## 2017-11-10 DIAGNOSIS — M6281 Muscle weakness (generalized): Secondary | ICD-10-CM | POA: Diagnosis not present

## 2017-11-10 DIAGNOSIS — M159 Polyosteoarthritis, unspecified: Secondary | ICD-10-CM | POA: Diagnosis not present

## 2017-11-15 DIAGNOSIS — M159 Polyosteoarthritis, unspecified: Secondary | ICD-10-CM | POA: Diagnosis not present

## 2017-11-15 DIAGNOSIS — R296 Repeated falls: Secondary | ICD-10-CM | POA: Diagnosis not present

## 2017-11-15 DIAGNOSIS — I2510 Atherosclerotic heart disease of native coronary artery without angina pectoris: Secondary | ICD-10-CM | POA: Diagnosis not present

## 2017-11-15 DIAGNOSIS — N183 Chronic kidney disease, stage 3 (moderate): Secondary | ICD-10-CM | POA: Diagnosis not present

## 2017-11-15 DIAGNOSIS — I129 Hypertensive chronic kidney disease with stage 1 through stage 4 chronic kidney disease, or unspecified chronic kidney disease: Secondary | ICD-10-CM | POA: Diagnosis not present

## 2017-11-15 DIAGNOSIS — M6281 Muscle weakness (generalized): Secondary | ICD-10-CM | POA: Diagnosis not present

## 2017-11-17 DIAGNOSIS — I129 Hypertensive chronic kidney disease with stage 1 through stage 4 chronic kidney disease, or unspecified chronic kidney disease: Secondary | ICD-10-CM | POA: Diagnosis not present

## 2017-11-17 DIAGNOSIS — M6281 Muscle weakness (generalized): Secondary | ICD-10-CM | POA: Diagnosis not present

## 2017-11-17 DIAGNOSIS — R296 Repeated falls: Secondary | ICD-10-CM | POA: Diagnosis not present

## 2017-11-17 DIAGNOSIS — N183 Chronic kidney disease, stage 3 (moderate): Secondary | ICD-10-CM | POA: Diagnosis not present

## 2017-11-17 DIAGNOSIS — M159 Polyosteoarthritis, unspecified: Secondary | ICD-10-CM | POA: Diagnosis not present

## 2017-11-17 DIAGNOSIS — I2510 Atherosclerotic heart disease of native coronary artery without angina pectoris: Secondary | ICD-10-CM | POA: Diagnosis not present

## 2017-11-22 DIAGNOSIS — M6281 Muscle weakness (generalized): Secondary | ICD-10-CM | POA: Diagnosis not present

## 2017-11-22 DIAGNOSIS — I129 Hypertensive chronic kidney disease with stage 1 through stage 4 chronic kidney disease, or unspecified chronic kidney disease: Secondary | ICD-10-CM | POA: Diagnosis not present

## 2017-11-22 DIAGNOSIS — R296 Repeated falls: Secondary | ICD-10-CM | POA: Diagnosis not present

## 2017-11-22 DIAGNOSIS — M159 Polyosteoarthritis, unspecified: Secondary | ICD-10-CM | POA: Diagnosis not present

## 2017-11-22 DIAGNOSIS — N183 Chronic kidney disease, stage 3 (moderate): Secondary | ICD-10-CM | POA: Diagnosis not present

## 2017-11-22 DIAGNOSIS — I2510 Atherosclerotic heart disease of native coronary artery without angina pectoris: Secondary | ICD-10-CM | POA: Diagnosis not present

## 2017-11-23 DIAGNOSIS — D631 Anemia in chronic kidney disease: Secondary | ICD-10-CM | POA: Diagnosis not present

## 2017-11-23 DIAGNOSIS — F0390 Unspecified dementia without behavioral disturbance: Secondary | ICD-10-CM | POA: Diagnosis not present

## 2017-11-23 DIAGNOSIS — Z6821 Body mass index (BMI) 21.0-21.9, adult: Secondary | ICD-10-CM | POA: Diagnosis not present

## 2017-11-23 DIAGNOSIS — I10 Essential (primary) hypertension: Secondary | ICD-10-CM | POA: Diagnosis not present

## 2017-11-23 DIAGNOSIS — G894 Chronic pain syndrome: Secondary | ICD-10-CM | POA: Diagnosis not present

## 2017-11-23 DIAGNOSIS — N184 Chronic kidney disease, stage 4 (severe): Secondary | ICD-10-CM | POA: Diagnosis not present

## 2017-11-23 DIAGNOSIS — K219 Gastro-esophageal reflux disease without esophagitis: Secondary | ICD-10-CM | POA: Diagnosis not present

## 2017-11-23 DIAGNOSIS — E0580 Other thyrotoxicosis without thyrotoxic crisis or storm: Secondary | ICD-10-CM | POA: Diagnosis not present

## 2017-11-23 DIAGNOSIS — F331 Major depressive disorder, recurrent, moderate: Secondary | ICD-10-CM | POA: Diagnosis not present

## 2017-11-23 DIAGNOSIS — M8580 Other specified disorders of bone density and structure, unspecified site: Secondary | ICD-10-CM | POA: Diagnosis not present

## 2017-11-24 DIAGNOSIS — R296 Repeated falls: Secondary | ICD-10-CM | POA: Diagnosis not present

## 2017-11-24 DIAGNOSIS — M159 Polyosteoarthritis, unspecified: Secondary | ICD-10-CM | POA: Diagnosis not present

## 2017-11-24 DIAGNOSIS — M6281 Muscle weakness (generalized): Secondary | ICD-10-CM | POA: Diagnosis not present

## 2017-11-24 DIAGNOSIS — I129 Hypertensive chronic kidney disease with stage 1 through stage 4 chronic kidney disease, or unspecified chronic kidney disease: Secondary | ICD-10-CM | POA: Diagnosis not present

## 2017-11-24 DIAGNOSIS — I2510 Atherosclerotic heart disease of native coronary artery without angina pectoris: Secondary | ICD-10-CM | POA: Diagnosis not present

## 2017-11-24 DIAGNOSIS — N183 Chronic kidney disease, stage 3 (moderate): Secondary | ICD-10-CM | POA: Diagnosis not present

## 2017-11-28 DIAGNOSIS — E039 Hypothyroidism, unspecified: Secondary | ICD-10-CM | POA: Diagnosis not present

## 2017-11-28 DIAGNOSIS — E559 Vitamin D deficiency, unspecified: Secondary | ICD-10-CM | POA: Diagnosis not present

## 2017-11-28 DIAGNOSIS — D649 Anemia, unspecified: Secondary | ICD-10-CM | POA: Diagnosis not present

## 2017-11-28 DIAGNOSIS — I10 Essential (primary) hypertension: Secondary | ICD-10-CM | POA: Diagnosis not present

## 2017-11-28 DIAGNOSIS — N179 Acute kidney failure, unspecified: Secondary | ICD-10-CM | POA: Diagnosis not present

## 2017-11-29 DIAGNOSIS — F0390 Unspecified dementia without behavioral disturbance: Secondary | ICD-10-CM | POA: Diagnosis not present

## 2017-11-29 DIAGNOSIS — I2510 Atherosclerotic heart disease of native coronary artery without angina pectoris: Secondary | ICD-10-CM | POA: Diagnosis not present

## 2017-11-29 DIAGNOSIS — N183 Chronic kidney disease, stage 3 (moderate): Secondary | ICD-10-CM | POA: Diagnosis not present

## 2017-11-29 DIAGNOSIS — M6281 Muscle weakness (generalized): Secondary | ICD-10-CM | POA: Diagnosis not present

## 2017-11-29 DIAGNOSIS — R799 Abnormal finding of blood chemistry, unspecified: Secondary | ICD-10-CM | POA: Diagnosis not present

## 2017-11-29 DIAGNOSIS — R54 Age-related physical debility: Secondary | ICD-10-CM | POA: Diagnosis not present

## 2017-11-29 DIAGNOSIS — M159 Polyosteoarthritis, unspecified: Secondary | ICD-10-CM | POA: Diagnosis not present

## 2017-11-29 DIAGNOSIS — N184 Chronic kidney disease, stage 4 (severe): Secondary | ICD-10-CM | POA: Diagnosis not present

## 2017-11-29 DIAGNOSIS — R296 Repeated falls: Secondary | ICD-10-CM | POA: Diagnosis not present

## 2017-11-29 DIAGNOSIS — I129 Hypertensive chronic kidney disease with stage 1 through stage 4 chronic kidney disease, or unspecified chronic kidney disease: Secondary | ICD-10-CM | POA: Diagnosis not present

## 2017-11-30 DIAGNOSIS — N183 Chronic kidney disease, stage 3 (moderate): Secondary | ICD-10-CM | POA: Diagnosis not present

## 2017-11-30 DIAGNOSIS — M6281 Muscle weakness (generalized): Secondary | ICD-10-CM | POA: Diagnosis not present

## 2017-11-30 DIAGNOSIS — M159 Polyosteoarthritis, unspecified: Secondary | ICD-10-CM | POA: Diagnosis not present

## 2017-11-30 DIAGNOSIS — I129 Hypertensive chronic kidney disease with stage 1 through stage 4 chronic kidney disease, or unspecified chronic kidney disease: Secondary | ICD-10-CM | POA: Diagnosis not present

## 2017-11-30 DIAGNOSIS — R296 Repeated falls: Secondary | ICD-10-CM | POA: Diagnosis not present

## 2017-11-30 DIAGNOSIS — I2510 Atherosclerotic heart disease of native coronary artery without angina pectoris: Secondary | ICD-10-CM | POA: Diagnosis not present

## 2017-12-05 DIAGNOSIS — M6281 Muscle weakness (generalized): Secondary | ICD-10-CM | POA: Diagnosis not present

## 2017-12-05 DIAGNOSIS — I129 Hypertensive chronic kidney disease with stage 1 through stage 4 chronic kidney disease, or unspecified chronic kidney disease: Secondary | ICD-10-CM | POA: Diagnosis not present

## 2017-12-05 DIAGNOSIS — I2510 Atherosclerotic heart disease of native coronary artery without angina pectoris: Secondary | ICD-10-CM | POA: Diagnosis not present

## 2017-12-05 DIAGNOSIS — M159 Polyosteoarthritis, unspecified: Secondary | ICD-10-CM | POA: Diagnosis not present

## 2017-12-05 DIAGNOSIS — R296 Repeated falls: Secondary | ICD-10-CM | POA: Diagnosis not present

## 2017-12-05 DIAGNOSIS — N183 Chronic kidney disease, stage 3 (moderate): Secondary | ICD-10-CM | POA: Diagnosis not present

## 2017-12-07 DIAGNOSIS — R296 Repeated falls: Secondary | ICD-10-CM | POA: Diagnosis not present

## 2017-12-07 DIAGNOSIS — I2510 Atherosclerotic heart disease of native coronary artery without angina pectoris: Secondary | ICD-10-CM | POA: Diagnosis not present

## 2017-12-07 DIAGNOSIS — N183 Chronic kidney disease, stage 3 (moderate): Secondary | ICD-10-CM | POA: Diagnosis not present

## 2017-12-07 DIAGNOSIS — M6281 Muscle weakness (generalized): Secondary | ICD-10-CM | POA: Diagnosis not present

## 2017-12-07 DIAGNOSIS — I129 Hypertensive chronic kidney disease with stage 1 through stage 4 chronic kidney disease, or unspecified chronic kidney disease: Secondary | ICD-10-CM | POA: Diagnosis not present

## 2017-12-07 DIAGNOSIS — M159 Polyosteoarthritis, unspecified: Secondary | ICD-10-CM | POA: Diagnosis not present

## 2017-12-13 DIAGNOSIS — I2510 Atherosclerotic heart disease of native coronary artery without angina pectoris: Secondary | ICD-10-CM | POA: Diagnosis not present

## 2017-12-13 DIAGNOSIS — M6281 Muscle weakness (generalized): Secondary | ICD-10-CM | POA: Diagnosis not present

## 2017-12-13 DIAGNOSIS — R296 Repeated falls: Secondary | ICD-10-CM | POA: Diagnosis not present

## 2017-12-13 DIAGNOSIS — N183 Chronic kidney disease, stage 3 (moderate): Secondary | ICD-10-CM | POA: Diagnosis not present

## 2017-12-13 DIAGNOSIS — I129 Hypertensive chronic kidney disease with stage 1 through stage 4 chronic kidney disease, or unspecified chronic kidney disease: Secondary | ICD-10-CM | POA: Diagnosis not present

## 2017-12-13 DIAGNOSIS — M159 Polyosteoarthritis, unspecified: Secondary | ICD-10-CM | POA: Diagnosis not present

## 2017-12-21 DIAGNOSIS — I10 Essential (primary) hypertension: Secondary | ICD-10-CM | POA: Diagnosis not present

## 2017-12-21 DIAGNOSIS — N183 Chronic kidney disease, stage 3 (moderate): Secondary | ICD-10-CM | POA: Diagnosis not present

## 2017-12-22 DIAGNOSIS — M159 Polyosteoarthritis, unspecified: Secondary | ICD-10-CM | POA: Diagnosis not present

## 2017-12-22 DIAGNOSIS — I129 Hypertensive chronic kidney disease with stage 1 through stage 4 chronic kidney disease, or unspecified chronic kidney disease: Secondary | ICD-10-CM | POA: Diagnosis not present

## 2017-12-22 DIAGNOSIS — M6281 Muscle weakness (generalized): Secondary | ICD-10-CM | POA: Diagnosis not present

## 2017-12-22 DIAGNOSIS — N183 Chronic kidney disease, stage 3 (moderate): Secondary | ICD-10-CM | POA: Diagnosis not present

## 2017-12-22 DIAGNOSIS — R296 Repeated falls: Secondary | ICD-10-CM | POA: Diagnosis not present

## 2017-12-22 DIAGNOSIS — I2510 Atherosclerotic heart disease of native coronary artery without angina pectoris: Secondary | ICD-10-CM | POA: Diagnosis not present

## 2017-12-26 DIAGNOSIS — I129 Hypertensive chronic kidney disease with stage 1 through stage 4 chronic kidney disease, or unspecified chronic kidney disease: Secondary | ICD-10-CM | POA: Diagnosis not present

## 2017-12-26 DIAGNOSIS — F0390 Unspecified dementia without behavioral disturbance: Secondary | ICD-10-CM | POA: Diagnosis not present

## 2017-12-26 DIAGNOSIS — Z9181 History of falling: Secondary | ICD-10-CM | POA: Diagnosis not present

## 2017-12-26 DIAGNOSIS — R296 Repeated falls: Secondary | ICD-10-CM | POA: Diagnosis not present

## 2017-12-26 DIAGNOSIS — F329 Major depressive disorder, single episode, unspecified: Secondary | ICD-10-CM | POA: Diagnosis not present

## 2017-12-26 DIAGNOSIS — M6281 Muscle weakness (generalized): Secondary | ICD-10-CM | POA: Diagnosis not present

## 2017-12-26 DIAGNOSIS — Z7982 Long term (current) use of aspirin: Secondary | ICD-10-CM | POA: Diagnosis not present

## 2017-12-26 DIAGNOSIS — N183 Chronic kidney disease, stage 3 (moderate): Secondary | ICD-10-CM | POA: Diagnosis not present

## 2017-12-26 DIAGNOSIS — I2510 Atherosclerotic heart disease of native coronary artery without angina pectoris: Secondary | ICD-10-CM | POA: Diagnosis not present

## 2017-12-26 DIAGNOSIS — M159 Polyosteoarthritis, unspecified: Secondary | ICD-10-CM | POA: Diagnosis not present

## 2017-12-26 DIAGNOSIS — G894 Chronic pain syndrome: Secondary | ICD-10-CM | POA: Diagnosis not present

## 2018-01-06 DIAGNOSIS — N183 Chronic kidney disease, stage 3 (moderate): Secondary | ICD-10-CM | POA: Diagnosis not present

## 2018-01-06 DIAGNOSIS — I129 Hypertensive chronic kidney disease with stage 1 through stage 4 chronic kidney disease, or unspecified chronic kidney disease: Secondary | ICD-10-CM | POA: Diagnosis not present

## 2018-01-06 DIAGNOSIS — M159 Polyosteoarthritis, unspecified: Secondary | ICD-10-CM | POA: Diagnosis not present

## 2018-01-06 DIAGNOSIS — M6281 Muscle weakness (generalized): Secondary | ICD-10-CM | POA: Diagnosis not present

## 2018-01-06 DIAGNOSIS — I2510 Atherosclerotic heart disease of native coronary artery without angina pectoris: Secondary | ICD-10-CM | POA: Diagnosis not present

## 2018-01-06 DIAGNOSIS — R296 Repeated falls: Secondary | ICD-10-CM | POA: Diagnosis not present

## 2018-01-10 DIAGNOSIS — M79671 Pain in right foot: Secondary | ICD-10-CM | POA: Diagnosis not present

## 2018-01-10 DIAGNOSIS — N390 Urinary tract infection, site not specified: Secondary | ICD-10-CM | POA: Diagnosis not present

## 2018-01-10 DIAGNOSIS — R54 Age-related physical debility: Secondary | ICD-10-CM | POA: Diagnosis not present

## 2018-01-10 DIAGNOSIS — N189 Chronic kidney disease, unspecified: Secondary | ICD-10-CM | POA: Diagnosis not present

## 2018-01-10 DIAGNOSIS — M6281 Muscle weakness (generalized): Secondary | ICD-10-CM | POA: Diagnosis not present

## 2018-01-10 DIAGNOSIS — R319 Hematuria, unspecified: Secondary | ICD-10-CM | POA: Diagnosis not present

## 2018-01-10 DIAGNOSIS — N200 Calculus of kidney: Secondary | ICD-10-CM | POA: Diagnosis not present

## 2018-01-10 DIAGNOSIS — R809 Proteinuria, unspecified: Secondary | ICD-10-CM | POA: Diagnosis not present

## 2018-01-10 DIAGNOSIS — R269 Unspecified abnormalities of gait and mobility: Secondary | ICD-10-CM | POA: Diagnosis not present

## 2018-01-10 DIAGNOSIS — M79672 Pain in left foot: Secondary | ICD-10-CM | POA: Diagnosis not present

## 2018-01-10 DIAGNOSIS — F0390 Unspecified dementia without behavioral disturbance: Secondary | ICD-10-CM | POA: Diagnosis not present

## 2018-01-12 DIAGNOSIS — M6281 Muscle weakness (generalized): Secondary | ICD-10-CM | POA: Diagnosis not present

## 2018-01-12 DIAGNOSIS — M159 Polyosteoarthritis, unspecified: Secondary | ICD-10-CM | POA: Diagnosis not present

## 2018-01-12 DIAGNOSIS — I2510 Atherosclerotic heart disease of native coronary artery without angina pectoris: Secondary | ICD-10-CM | POA: Diagnosis not present

## 2018-01-12 DIAGNOSIS — D631 Anemia in chronic kidney disease: Secondary | ICD-10-CM | POA: Diagnosis not present

## 2018-01-12 DIAGNOSIS — I129 Hypertensive chronic kidney disease with stage 1 through stage 4 chronic kidney disease, or unspecified chronic kidney disease: Secondary | ICD-10-CM | POA: Diagnosis not present

## 2018-01-12 DIAGNOSIS — R296 Repeated falls: Secondary | ICD-10-CM | POA: Diagnosis not present

## 2018-01-12 DIAGNOSIS — N183 Chronic kidney disease, stage 3 (moderate): Secondary | ICD-10-CM | POA: Diagnosis not present

## 2018-01-16 DIAGNOSIS — M79672 Pain in left foot: Secondary | ICD-10-CM | POA: Diagnosis not present

## 2018-01-16 DIAGNOSIS — R600 Localized edema: Secondary | ICD-10-CM | POA: Diagnosis not present

## 2018-01-16 DIAGNOSIS — L97509 Non-pressure chronic ulcer of other part of unspecified foot with unspecified severity: Secondary | ICD-10-CM | POA: Diagnosis not present

## 2018-01-16 DIAGNOSIS — R54 Age-related physical debility: Secondary | ICD-10-CM | POA: Diagnosis not present

## 2018-01-16 DIAGNOSIS — F0390 Unspecified dementia without behavioral disturbance: Secondary | ICD-10-CM | POA: Diagnosis not present

## 2018-01-18 DIAGNOSIS — N390 Urinary tract infection, site not specified: Secondary | ICD-10-CM | POA: Diagnosis not present

## 2018-01-18 DIAGNOSIS — I10 Essential (primary) hypertension: Secondary | ICD-10-CM | POA: Diagnosis not present

## 2018-01-18 DIAGNOSIS — N183 Chronic kidney disease, stage 3 (moderate): Secondary | ICD-10-CM | POA: Diagnosis not present

## 2018-01-20 DIAGNOSIS — M6281 Muscle weakness (generalized): Secondary | ICD-10-CM | POA: Diagnosis not present

## 2018-01-20 DIAGNOSIS — I129 Hypertensive chronic kidney disease with stage 1 through stage 4 chronic kidney disease, or unspecified chronic kidney disease: Secondary | ICD-10-CM | POA: Diagnosis not present

## 2018-01-20 DIAGNOSIS — N183 Chronic kidney disease, stage 3 (moderate): Secondary | ICD-10-CM | POA: Diagnosis not present

## 2018-01-20 DIAGNOSIS — R296 Repeated falls: Secondary | ICD-10-CM | POA: Diagnosis not present

## 2018-01-20 DIAGNOSIS — I2510 Atherosclerotic heart disease of native coronary artery without angina pectoris: Secondary | ICD-10-CM | POA: Diagnosis not present

## 2018-01-20 DIAGNOSIS — M159 Polyosteoarthritis, unspecified: Secondary | ICD-10-CM | POA: Diagnosis not present

## 2018-01-23 DIAGNOSIS — I739 Peripheral vascular disease, unspecified: Secondary | ICD-10-CM | POA: Diagnosis not present

## 2018-01-26 DIAGNOSIS — R296 Repeated falls: Secondary | ICD-10-CM | POA: Diagnosis not present

## 2018-01-26 DIAGNOSIS — I2510 Atherosclerotic heart disease of native coronary artery without angina pectoris: Secondary | ICD-10-CM | POA: Diagnosis not present

## 2018-01-26 DIAGNOSIS — M159 Polyosteoarthritis, unspecified: Secondary | ICD-10-CM | POA: Diagnosis not present

## 2018-01-26 DIAGNOSIS — M6281 Muscle weakness (generalized): Secondary | ICD-10-CM | POA: Diagnosis not present

## 2018-01-26 DIAGNOSIS — N183 Chronic kidney disease, stage 3 (moderate): Secondary | ICD-10-CM | POA: Diagnosis not present

## 2018-01-26 DIAGNOSIS — I129 Hypertensive chronic kidney disease with stage 1 through stage 4 chronic kidney disease, or unspecified chronic kidney disease: Secondary | ICD-10-CM | POA: Diagnosis not present

## 2018-01-31 DIAGNOSIS — Z23 Encounter for immunization: Secondary | ICD-10-CM | POA: Diagnosis not present

## 2018-02-01 DIAGNOSIS — I10 Essential (primary) hypertension: Secondary | ICD-10-CM | POA: Diagnosis not present

## 2018-02-02 DIAGNOSIS — N183 Chronic kidney disease, stage 3 (moderate): Secondary | ICD-10-CM | POA: Diagnosis not present

## 2018-02-02 DIAGNOSIS — M159 Polyosteoarthritis, unspecified: Secondary | ICD-10-CM | POA: Diagnosis not present

## 2018-02-02 DIAGNOSIS — I2510 Atherosclerotic heart disease of native coronary artery without angina pectoris: Secondary | ICD-10-CM | POA: Diagnosis not present

## 2018-02-02 DIAGNOSIS — R296 Repeated falls: Secondary | ICD-10-CM | POA: Diagnosis not present

## 2018-02-02 DIAGNOSIS — I129 Hypertensive chronic kidney disease with stage 1 through stage 4 chronic kidney disease, or unspecified chronic kidney disease: Secondary | ICD-10-CM | POA: Diagnosis not present

## 2018-02-02 DIAGNOSIS — M6281 Muscle weakness (generalized): Secondary | ICD-10-CM | POA: Diagnosis not present

## 2018-02-10 DIAGNOSIS — M159 Polyosteoarthritis, unspecified: Secondary | ICD-10-CM | POA: Diagnosis not present

## 2018-02-10 DIAGNOSIS — R296 Repeated falls: Secondary | ICD-10-CM | POA: Diagnosis not present

## 2018-02-10 DIAGNOSIS — I129 Hypertensive chronic kidney disease with stage 1 through stage 4 chronic kidney disease, or unspecified chronic kidney disease: Secondary | ICD-10-CM | POA: Diagnosis not present

## 2018-02-10 DIAGNOSIS — N183 Chronic kidney disease, stage 3 (moderate): Secondary | ICD-10-CM | POA: Diagnosis not present

## 2018-02-10 DIAGNOSIS — M6281 Muscle weakness (generalized): Secondary | ICD-10-CM | POA: Diagnosis not present

## 2018-02-10 DIAGNOSIS — I2510 Atherosclerotic heart disease of native coronary artery without angina pectoris: Secondary | ICD-10-CM | POA: Diagnosis not present

## 2018-02-13 DIAGNOSIS — D631 Anemia in chronic kidney disease: Secondary | ICD-10-CM | POA: Diagnosis not present

## 2018-02-15 DIAGNOSIS — D518 Other vitamin B12 deficiency anemias: Secondary | ICD-10-CM | POA: Diagnosis not present

## 2018-02-15 DIAGNOSIS — N183 Chronic kidney disease, stage 3 (moderate): Secondary | ICD-10-CM | POA: Diagnosis not present

## 2018-02-15 DIAGNOSIS — I10 Essential (primary) hypertension: Secondary | ICD-10-CM | POA: Diagnosis not present

## 2018-02-15 DIAGNOSIS — D509 Iron deficiency anemia, unspecified: Secondary | ICD-10-CM | POA: Diagnosis not present

## 2018-02-16 DIAGNOSIS — M79601 Pain in right arm: Secondary | ICD-10-CM | POA: Diagnosis not present

## 2018-02-16 DIAGNOSIS — F0390 Unspecified dementia without behavioral disturbance: Secondary | ICD-10-CM | POA: Diagnosis not present

## 2018-02-16 DIAGNOSIS — R600 Localized edema: Secondary | ICD-10-CM | POA: Diagnosis not present

## 2018-02-16 DIAGNOSIS — R54 Age-related physical debility: Secondary | ICD-10-CM | POA: Diagnosis not present

## 2018-02-17 DIAGNOSIS — M6281 Muscle weakness (generalized): Secondary | ICD-10-CM | POA: Diagnosis not present

## 2018-02-17 DIAGNOSIS — M159 Polyosteoarthritis, unspecified: Secondary | ICD-10-CM | POA: Diagnosis not present

## 2018-02-17 DIAGNOSIS — R296 Repeated falls: Secondary | ICD-10-CM | POA: Diagnosis not present

## 2018-02-17 DIAGNOSIS — N183 Chronic kidney disease, stage 3 (moderate): Secondary | ICD-10-CM | POA: Diagnosis not present

## 2018-02-17 DIAGNOSIS — I2510 Atherosclerotic heart disease of native coronary artery without angina pectoris: Secondary | ICD-10-CM | POA: Diagnosis not present

## 2018-02-17 DIAGNOSIS — I129 Hypertensive chronic kidney disease with stage 1 through stage 4 chronic kidney disease, or unspecified chronic kidney disease: Secondary | ICD-10-CM | POA: Diagnosis not present

## 2018-02-23 DIAGNOSIS — M159 Polyosteoarthritis, unspecified: Secondary | ICD-10-CM | POA: Diagnosis not present

## 2018-02-23 DIAGNOSIS — N183 Chronic kidney disease, stage 3 (moderate): Secondary | ICD-10-CM | POA: Diagnosis not present

## 2018-02-23 DIAGNOSIS — M6281 Muscle weakness (generalized): Secondary | ICD-10-CM | POA: Diagnosis not present

## 2018-02-23 DIAGNOSIS — I2510 Atherosclerotic heart disease of native coronary artery without angina pectoris: Secondary | ICD-10-CM | POA: Diagnosis not present

## 2018-02-23 DIAGNOSIS — R296 Repeated falls: Secondary | ICD-10-CM | POA: Diagnosis not present

## 2018-02-23 DIAGNOSIS — I129 Hypertensive chronic kidney disease with stage 1 through stage 4 chronic kidney disease, or unspecified chronic kidney disease: Secondary | ICD-10-CM | POA: Diagnosis not present

## 2018-02-24 DIAGNOSIS — G894 Chronic pain syndrome: Secondary | ICD-10-CM | POA: Diagnosis not present

## 2018-02-24 DIAGNOSIS — I2510 Atherosclerotic heart disease of native coronary artery without angina pectoris: Secondary | ICD-10-CM | POA: Diagnosis not present

## 2018-02-24 DIAGNOSIS — F0390 Unspecified dementia without behavioral disturbance: Secondary | ICD-10-CM | POA: Diagnosis not present

## 2018-02-24 DIAGNOSIS — N183 Chronic kidney disease, stage 3 (moderate): Secondary | ICD-10-CM | POA: Diagnosis not present

## 2018-02-24 DIAGNOSIS — F329 Major depressive disorder, single episode, unspecified: Secondary | ICD-10-CM | POA: Diagnosis not present

## 2018-02-24 DIAGNOSIS — M6281 Muscle weakness (generalized): Secondary | ICD-10-CM | POA: Diagnosis not present

## 2018-02-24 DIAGNOSIS — I129 Hypertensive chronic kidney disease with stage 1 through stage 4 chronic kidney disease, or unspecified chronic kidney disease: Secondary | ICD-10-CM | POA: Diagnosis not present

## 2018-02-24 DIAGNOSIS — M159 Polyosteoarthritis, unspecified: Secondary | ICD-10-CM | POA: Diagnosis not present

## 2018-02-24 DIAGNOSIS — Z7982 Long term (current) use of aspirin: Secondary | ICD-10-CM | POA: Diagnosis not present

## 2018-02-24 DIAGNOSIS — Z9181 History of falling: Secondary | ICD-10-CM | POA: Diagnosis not present

## 2018-02-24 DIAGNOSIS — R296 Repeated falls: Secondary | ICD-10-CM | POA: Diagnosis not present

## 2018-02-28 DIAGNOSIS — I739 Peripheral vascular disease, unspecified: Secondary | ICD-10-CM | POA: Diagnosis not present

## 2018-02-28 DIAGNOSIS — F0390 Unspecified dementia without behavioral disturbance: Secondary | ICD-10-CM | POA: Diagnosis not present

## 2018-02-28 DIAGNOSIS — N184 Chronic kidney disease, stage 4 (severe): Secondary | ICD-10-CM | POA: Diagnosis not present

## 2018-02-28 DIAGNOSIS — R269 Unspecified abnormalities of gait and mobility: Secondary | ICD-10-CM | POA: Diagnosis not present

## 2018-02-28 DIAGNOSIS — I129 Hypertensive chronic kidney disease with stage 1 through stage 4 chronic kidney disease, or unspecified chronic kidney disease: Secondary | ICD-10-CM | POA: Diagnosis not present

## 2018-02-28 DIAGNOSIS — Z9181 History of falling: Secondary | ICD-10-CM | POA: Diagnosis not present

## 2018-02-28 DIAGNOSIS — M5481 Occipital neuralgia: Secondary | ICD-10-CM | POA: Diagnosis not present

## 2018-02-28 DIAGNOSIS — M6281 Muscle weakness (generalized): Secondary | ICD-10-CM | POA: Diagnosis not present

## 2018-02-28 DIAGNOSIS — G894 Chronic pain syndrome: Secondary | ICD-10-CM | POA: Diagnosis not present

## 2018-03-02 DIAGNOSIS — I129 Hypertensive chronic kidney disease with stage 1 through stage 4 chronic kidney disease, or unspecified chronic kidney disease: Secondary | ICD-10-CM | POA: Diagnosis not present

## 2018-03-02 DIAGNOSIS — M159 Polyosteoarthritis, unspecified: Secondary | ICD-10-CM | POA: Diagnosis not present

## 2018-03-02 DIAGNOSIS — I2510 Atherosclerotic heart disease of native coronary artery without angina pectoris: Secondary | ICD-10-CM | POA: Diagnosis not present

## 2018-03-02 DIAGNOSIS — R296 Repeated falls: Secondary | ICD-10-CM | POA: Diagnosis not present

## 2018-03-02 DIAGNOSIS — M6281 Muscle weakness (generalized): Secondary | ICD-10-CM | POA: Diagnosis not present

## 2018-03-02 DIAGNOSIS — N183 Chronic kidney disease, stage 3 (moderate): Secondary | ICD-10-CM | POA: Diagnosis not present

## 2018-03-10 DIAGNOSIS — M6281 Muscle weakness (generalized): Secondary | ICD-10-CM | POA: Diagnosis not present

## 2018-03-10 DIAGNOSIS — N183 Chronic kidney disease, stage 3 (moderate): Secondary | ICD-10-CM | POA: Diagnosis not present

## 2018-03-10 DIAGNOSIS — I129 Hypertensive chronic kidney disease with stage 1 through stage 4 chronic kidney disease, or unspecified chronic kidney disease: Secondary | ICD-10-CM | POA: Diagnosis not present

## 2018-03-10 DIAGNOSIS — R296 Repeated falls: Secondary | ICD-10-CM | POA: Diagnosis not present

## 2018-03-10 DIAGNOSIS — I2510 Atherosclerotic heart disease of native coronary artery without angina pectoris: Secondary | ICD-10-CM | POA: Diagnosis not present

## 2018-03-10 DIAGNOSIS — M159 Polyosteoarthritis, unspecified: Secondary | ICD-10-CM | POA: Diagnosis not present

## 2018-03-16 DIAGNOSIS — M159 Polyosteoarthritis, unspecified: Secondary | ICD-10-CM | POA: Diagnosis not present

## 2018-03-16 DIAGNOSIS — I2510 Atherosclerotic heart disease of native coronary artery without angina pectoris: Secondary | ICD-10-CM | POA: Diagnosis not present

## 2018-03-16 DIAGNOSIS — D631 Anemia in chronic kidney disease: Secondary | ICD-10-CM | POA: Diagnosis not present

## 2018-03-16 DIAGNOSIS — M6281 Muscle weakness (generalized): Secondary | ICD-10-CM | POA: Diagnosis not present

## 2018-03-16 DIAGNOSIS — I129 Hypertensive chronic kidney disease with stage 1 through stage 4 chronic kidney disease, or unspecified chronic kidney disease: Secondary | ICD-10-CM | POA: Diagnosis not present

## 2018-03-16 DIAGNOSIS — R296 Repeated falls: Secondary | ICD-10-CM | POA: Diagnosis not present

## 2018-03-16 DIAGNOSIS — N183 Chronic kidney disease, stage 3 (moderate): Secondary | ICD-10-CM | POA: Diagnosis not present

## 2018-03-20 DIAGNOSIS — M6281 Muscle weakness (generalized): Secondary | ICD-10-CM | POA: Diagnosis not present

## 2018-03-20 DIAGNOSIS — R296 Repeated falls: Secondary | ICD-10-CM | POA: Diagnosis not present

## 2018-03-20 DIAGNOSIS — I129 Hypertensive chronic kidney disease with stage 1 through stage 4 chronic kidney disease, or unspecified chronic kidney disease: Secondary | ICD-10-CM | POA: Diagnosis not present

## 2018-03-20 DIAGNOSIS — I2510 Atherosclerotic heart disease of native coronary artery without angina pectoris: Secondary | ICD-10-CM | POA: Diagnosis not present

## 2018-03-20 DIAGNOSIS — M159 Polyosteoarthritis, unspecified: Secondary | ICD-10-CM | POA: Diagnosis not present

## 2018-03-20 DIAGNOSIS — N183 Chronic kidney disease, stage 3 (moderate): Secondary | ICD-10-CM | POA: Diagnosis not present

## 2018-03-25 DIAGNOSIS — D649 Anemia, unspecified: Secondary | ICD-10-CM | POA: Diagnosis not present

## 2018-03-25 DIAGNOSIS — I10 Essential (primary) hypertension: Secondary | ICD-10-CM | POA: Diagnosis not present

## 2018-03-29 DIAGNOSIS — N183 Chronic kidney disease, stage 3 (moderate): Secondary | ICD-10-CM | POA: Diagnosis not present

## 2018-03-29 DIAGNOSIS — N390 Urinary tract infection, site not specified: Secondary | ICD-10-CM | POA: Diagnosis not present

## 2018-03-29 DIAGNOSIS — I10 Essential (primary) hypertension: Secondary | ICD-10-CM | POA: Diagnosis not present

## 2018-03-30 DIAGNOSIS — I129 Hypertensive chronic kidney disease with stage 1 through stage 4 chronic kidney disease, or unspecified chronic kidney disease: Secondary | ICD-10-CM | POA: Diagnosis not present

## 2018-03-30 DIAGNOSIS — I2510 Atherosclerotic heart disease of native coronary artery without angina pectoris: Secondary | ICD-10-CM | POA: Diagnosis not present

## 2018-03-30 DIAGNOSIS — M159 Polyosteoarthritis, unspecified: Secondary | ICD-10-CM | POA: Diagnosis not present

## 2018-03-30 DIAGNOSIS — N183 Chronic kidney disease, stage 3 (moderate): Secondary | ICD-10-CM | POA: Diagnosis not present

## 2018-03-30 DIAGNOSIS — R296 Repeated falls: Secondary | ICD-10-CM | POA: Diagnosis not present

## 2018-03-30 DIAGNOSIS — M6281 Muscle weakness (generalized): Secondary | ICD-10-CM | POA: Diagnosis not present

## 2018-04-06 DIAGNOSIS — M6281 Muscle weakness (generalized): Secondary | ICD-10-CM | POA: Diagnosis not present

## 2018-04-06 DIAGNOSIS — M159 Polyosteoarthritis, unspecified: Secondary | ICD-10-CM | POA: Diagnosis not present

## 2018-04-06 DIAGNOSIS — I2510 Atherosclerotic heart disease of native coronary artery without angina pectoris: Secondary | ICD-10-CM | POA: Diagnosis not present

## 2018-04-06 DIAGNOSIS — N183 Chronic kidney disease, stage 3 (moderate): Secondary | ICD-10-CM | POA: Diagnosis not present

## 2018-04-06 DIAGNOSIS — I129 Hypertensive chronic kidney disease with stage 1 through stage 4 chronic kidney disease, or unspecified chronic kidney disease: Secondary | ICD-10-CM | POA: Diagnosis not present

## 2018-04-06 DIAGNOSIS — R296 Repeated falls: Secondary | ICD-10-CM | POA: Diagnosis not present

## 2018-04-13 DIAGNOSIS — D631 Anemia in chronic kidney disease: Secondary | ICD-10-CM | POA: Diagnosis not present

## 2018-04-14 DIAGNOSIS — M159 Polyosteoarthritis, unspecified: Secondary | ICD-10-CM | POA: Diagnosis not present

## 2018-04-14 DIAGNOSIS — I2510 Atherosclerotic heart disease of native coronary artery without angina pectoris: Secondary | ICD-10-CM | POA: Diagnosis not present

## 2018-04-14 DIAGNOSIS — I129 Hypertensive chronic kidney disease with stage 1 through stage 4 chronic kidney disease, or unspecified chronic kidney disease: Secondary | ICD-10-CM | POA: Diagnosis not present

## 2018-04-14 DIAGNOSIS — M6281 Muscle weakness (generalized): Secondary | ICD-10-CM | POA: Diagnosis not present

## 2018-04-14 DIAGNOSIS — N183 Chronic kidney disease, stage 3 (moderate): Secondary | ICD-10-CM | POA: Diagnosis not present

## 2018-04-14 DIAGNOSIS — R296 Repeated falls: Secondary | ICD-10-CM | POA: Diagnosis not present

## 2018-04-21 DIAGNOSIS — M6281 Muscle weakness (generalized): Secondary | ICD-10-CM | POA: Diagnosis not present

## 2018-04-21 DIAGNOSIS — M159 Polyosteoarthritis, unspecified: Secondary | ICD-10-CM | POA: Diagnosis not present

## 2018-04-21 DIAGNOSIS — I129 Hypertensive chronic kidney disease with stage 1 through stage 4 chronic kidney disease, or unspecified chronic kidney disease: Secondary | ICD-10-CM | POA: Diagnosis not present

## 2018-04-21 DIAGNOSIS — R296 Repeated falls: Secondary | ICD-10-CM | POA: Diagnosis not present

## 2018-04-21 DIAGNOSIS — N183 Chronic kidney disease, stage 3 (moderate): Secondary | ICD-10-CM | POA: Diagnosis not present

## 2018-04-21 DIAGNOSIS — I2510 Atherosclerotic heart disease of native coronary artery without angina pectoris: Secondary | ICD-10-CM | POA: Diagnosis not present

## 2018-04-24 DIAGNOSIS — R509 Fever, unspecified: Secondary | ICD-10-CM | POA: Diagnosis not present

## 2018-04-24 DIAGNOSIS — R0989 Other specified symptoms and signs involving the circulatory and respiratory systems: Secondary | ICD-10-CM | POA: Diagnosis not present

## 2018-04-24 DIAGNOSIS — F0390 Unspecified dementia without behavioral disturbance: Secondary | ICD-10-CM | POA: Diagnosis not present

## 2018-04-24 DIAGNOSIS — R5381 Other malaise: Secondary | ICD-10-CM | POA: Diagnosis not present

## 2018-04-24 DIAGNOSIS — R05 Cough: Secondary | ICD-10-CM | POA: Diagnosis not present

## 2018-04-25 DIAGNOSIS — M159 Polyosteoarthritis, unspecified: Secondary | ICD-10-CM | POA: Diagnosis not present

## 2018-04-25 DIAGNOSIS — Z9181 History of falling: Secondary | ICD-10-CM | POA: Diagnosis not present

## 2018-04-25 DIAGNOSIS — G894 Chronic pain syndrome: Secondary | ICD-10-CM | POA: Diagnosis not present

## 2018-04-25 DIAGNOSIS — I129 Hypertensive chronic kidney disease with stage 1 through stage 4 chronic kidney disease, or unspecified chronic kidney disease: Secondary | ICD-10-CM | POA: Diagnosis not present

## 2018-04-25 DIAGNOSIS — F0390 Unspecified dementia without behavioral disturbance: Secondary | ICD-10-CM | POA: Diagnosis not present

## 2018-04-25 DIAGNOSIS — R296 Repeated falls: Secondary | ICD-10-CM | POA: Diagnosis not present

## 2018-04-25 DIAGNOSIS — Z7982 Long term (current) use of aspirin: Secondary | ICD-10-CM | POA: Diagnosis not present

## 2018-04-25 DIAGNOSIS — F329 Major depressive disorder, single episode, unspecified: Secondary | ICD-10-CM | POA: Diagnosis not present

## 2018-04-25 DIAGNOSIS — I2510 Atherosclerotic heart disease of native coronary artery without angina pectoris: Secondary | ICD-10-CM | POA: Diagnosis not present

## 2018-04-25 DIAGNOSIS — N183 Chronic kidney disease, stage 3 (moderate): Secondary | ICD-10-CM | POA: Diagnosis not present

## 2018-04-25 DIAGNOSIS — M6281 Muscle weakness (generalized): Secondary | ICD-10-CM | POA: Diagnosis not present

## 2018-04-27 DIAGNOSIS — I2510 Atherosclerotic heart disease of native coronary artery without angina pectoris: Secondary | ICD-10-CM | POA: Diagnosis not present

## 2018-04-27 DIAGNOSIS — N183 Chronic kidney disease, stage 3 (moderate): Secondary | ICD-10-CM | POA: Diagnosis not present

## 2018-04-27 DIAGNOSIS — M159 Polyosteoarthritis, unspecified: Secondary | ICD-10-CM | POA: Diagnosis not present

## 2018-04-27 DIAGNOSIS — M6281 Muscle weakness (generalized): Secondary | ICD-10-CM | POA: Diagnosis not present

## 2018-04-27 DIAGNOSIS — I129 Hypertensive chronic kidney disease with stage 1 through stage 4 chronic kidney disease, or unspecified chronic kidney disease: Secondary | ICD-10-CM | POA: Diagnosis not present

## 2018-04-27 DIAGNOSIS — R296 Repeated falls: Secondary | ICD-10-CM | POA: Diagnosis not present

## 2018-05-04 DIAGNOSIS — M159 Polyosteoarthritis, unspecified: Secondary | ICD-10-CM | POA: Diagnosis not present

## 2018-05-04 DIAGNOSIS — I129 Hypertensive chronic kidney disease with stage 1 through stage 4 chronic kidney disease, or unspecified chronic kidney disease: Secondary | ICD-10-CM | POA: Diagnosis not present

## 2018-05-04 DIAGNOSIS — N183 Chronic kidney disease, stage 3 (moderate): Secondary | ICD-10-CM | POA: Diagnosis not present

## 2018-05-04 DIAGNOSIS — M6281 Muscle weakness (generalized): Secondary | ICD-10-CM | POA: Diagnosis not present

## 2018-05-04 DIAGNOSIS — R296 Repeated falls: Secondary | ICD-10-CM | POA: Diagnosis not present

## 2018-05-04 DIAGNOSIS — I2510 Atherosclerotic heart disease of native coronary artery without angina pectoris: Secondary | ICD-10-CM | POA: Diagnosis not present

## 2018-05-11 DIAGNOSIS — M6281 Muscle weakness (generalized): Secondary | ICD-10-CM | POA: Diagnosis not present

## 2018-05-11 DIAGNOSIS — M159 Polyosteoarthritis, unspecified: Secondary | ICD-10-CM | POA: Diagnosis not present

## 2018-05-11 DIAGNOSIS — R296 Repeated falls: Secondary | ICD-10-CM | POA: Diagnosis not present

## 2018-05-11 DIAGNOSIS — N183 Chronic kidney disease, stage 3 (moderate): Secondary | ICD-10-CM | POA: Diagnosis not present

## 2018-05-11 DIAGNOSIS — I2510 Atherosclerotic heart disease of native coronary artery without angina pectoris: Secondary | ICD-10-CM | POA: Diagnosis not present

## 2018-05-11 DIAGNOSIS — I129 Hypertensive chronic kidney disease with stage 1 through stage 4 chronic kidney disease, or unspecified chronic kidney disease: Secondary | ICD-10-CM | POA: Diagnosis not present

## 2018-05-12 DIAGNOSIS — F0390 Unspecified dementia without behavioral disturbance: Secondary | ICD-10-CM | POA: Diagnosis not present

## 2018-05-12 DIAGNOSIS — H9190 Unspecified hearing loss, unspecified ear: Secondary | ICD-10-CM | POA: Diagnosis not present

## 2018-05-12 DIAGNOSIS — H6123 Impacted cerumen, bilateral: Secondary | ICD-10-CM | POA: Diagnosis not present

## 2018-05-12 DIAGNOSIS — H9209 Otalgia, unspecified ear: Secondary | ICD-10-CM | POA: Diagnosis not present

## 2018-05-18 DIAGNOSIS — M159 Polyosteoarthritis, unspecified: Secondary | ICD-10-CM | POA: Diagnosis not present

## 2018-05-18 DIAGNOSIS — N183 Chronic kidney disease, stage 3 (moderate): Secondary | ICD-10-CM | POA: Diagnosis not present

## 2018-05-18 DIAGNOSIS — R296 Repeated falls: Secondary | ICD-10-CM | POA: Diagnosis not present

## 2018-05-18 DIAGNOSIS — M6281 Muscle weakness (generalized): Secondary | ICD-10-CM | POA: Diagnosis not present

## 2018-05-18 DIAGNOSIS — I129 Hypertensive chronic kidney disease with stage 1 through stage 4 chronic kidney disease, or unspecified chronic kidney disease: Secondary | ICD-10-CM | POA: Diagnosis not present

## 2018-05-18 DIAGNOSIS — I2510 Atherosclerotic heart disease of native coronary artery without angina pectoris: Secondary | ICD-10-CM | POA: Diagnosis not present

## 2018-05-19 DIAGNOSIS — D631 Anemia in chronic kidney disease: Secondary | ICD-10-CM | POA: Diagnosis not present

## 2018-05-25 DIAGNOSIS — I129 Hypertensive chronic kidney disease with stage 1 through stage 4 chronic kidney disease, or unspecified chronic kidney disease: Secondary | ICD-10-CM | POA: Diagnosis not present

## 2018-05-25 DIAGNOSIS — R296 Repeated falls: Secondary | ICD-10-CM | POA: Diagnosis not present

## 2018-05-25 DIAGNOSIS — M159 Polyosteoarthritis, unspecified: Secondary | ICD-10-CM | POA: Diagnosis not present

## 2018-05-25 DIAGNOSIS — M6281 Muscle weakness (generalized): Secondary | ICD-10-CM | POA: Diagnosis not present

## 2018-05-25 DIAGNOSIS — I2510 Atherosclerotic heart disease of native coronary artery without angina pectoris: Secondary | ICD-10-CM | POA: Diagnosis not present

## 2018-05-25 DIAGNOSIS — N183 Chronic kidney disease, stage 3 (moderate): Secondary | ICD-10-CM | POA: Diagnosis not present

## 2018-05-31 DIAGNOSIS — I2510 Atherosclerotic heart disease of native coronary artery without angina pectoris: Secondary | ICD-10-CM | POA: Diagnosis not present

## 2018-05-31 DIAGNOSIS — N183 Chronic kidney disease, stage 3 (moderate): Secondary | ICD-10-CM | POA: Diagnosis not present

## 2018-05-31 DIAGNOSIS — M159 Polyosteoarthritis, unspecified: Secondary | ICD-10-CM | POA: Diagnosis not present

## 2018-05-31 DIAGNOSIS — R296 Repeated falls: Secondary | ICD-10-CM | POA: Diagnosis not present

## 2018-05-31 DIAGNOSIS — M6281 Muscle weakness (generalized): Secondary | ICD-10-CM | POA: Diagnosis not present

## 2018-05-31 DIAGNOSIS — I129 Hypertensive chronic kidney disease with stage 1 through stage 4 chronic kidney disease, or unspecified chronic kidney disease: Secondary | ICD-10-CM | POA: Diagnosis not present

## 2018-06-05 DIAGNOSIS — N390 Urinary tract infection, site not specified: Secondary | ICD-10-CM | POA: Diagnosis not present

## 2018-06-06 DIAGNOSIS — N390 Urinary tract infection, site not specified: Secondary | ICD-10-CM | POA: Diagnosis not present

## 2018-06-08 DIAGNOSIS — N183 Chronic kidney disease, stage 3 (moderate): Secondary | ICD-10-CM | POA: Diagnosis not present

## 2018-06-08 DIAGNOSIS — I2510 Atherosclerotic heart disease of native coronary artery without angina pectoris: Secondary | ICD-10-CM | POA: Diagnosis not present

## 2018-06-08 DIAGNOSIS — M6281 Muscle weakness (generalized): Secondary | ICD-10-CM | POA: Diagnosis not present

## 2018-06-08 DIAGNOSIS — M159 Polyosteoarthritis, unspecified: Secondary | ICD-10-CM | POA: Diagnosis not present

## 2018-06-08 DIAGNOSIS — R296 Repeated falls: Secondary | ICD-10-CM | POA: Diagnosis not present

## 2018-06-08 DIAGNOSIS — I129 Hypertensive chronic kidney disease with stage 1 through stage 4 chronic kidney disease, or unspecified chronic kidney disease: Secondary | ICD-10-CM | POA: Diagnosis not present

## 2018-06-12 DIAGNOSIS — Z743 Need for continuous supervision: Secondary | ICD-10-CM | POA: Diagnosis not present

## 2018-06-12 DIAGNOSIS — R531 Weakness: Secondary | ICD-10-CM | POA: Diagnosis not present

## 2018-06-12 DIAGNOSIS — B9689 Other specified bacterial agents as the cause of diseases classified elsewhere: Secondary | ICD-10-CM | POA: Diagnosis not present

## 2018-06-12 DIAGNOSIS — N390 Urinary tract infection, site not specified: Secondary | ICD-10-CM | POA: Diagnosis not present

## 2018-06-12 DIAGNOSIS — R5381 Other malaise: Secondary | ICD-10-CM | POA: Diagnosis not present

## 2018-06-12 DIAGNOSIS — R0902 Hypoxemia: Secondary | ICD-10-CM | POA: Diagnosis not present

## 2018-06-12 DIAGNOSIS — R410 Disorientation, unspecified: Secondary | ICD-10-CM | POA: Diagnosis not present

## 2018-06-13 DIAGNOSIS — W19XXXA Unspecified fall, initial encounter: Secondary | ICD-10-CM | POA: Diagnosis not present

## 2018-06-13 DIAGNOSIS — R269 Unspecified abnormalities of gait and mobility: Secondary | ICD-10-CM | POA: Diagnosis not present

## 2018-06-13 DIAGNOSIS — I129 Hypertensive chronic kidney disease with stage 1 through stage 4 chronic kidney disease, or unspecified chronic kidney disease: Secondary | ICD-10-CM | POA: Diagnosis not present

## 2018-06-13 DIAGNOSIS — N390 Urinary tract infection, site not specified: Secondary | ICD-10-CM | POA: Diagnosis not present

## 2018-06-13 DIAGNOSIS — E0590 Thyrotoxicosis, unspecified without thyrotoxic crisis or storm: Secondary | ICD-10-CM | POA: Diagnosis not present

## 2018-06-13 DIAGNOSIS — R4182 Altered mental status, unspecified: Secondary | ICD-10-CM | POA: Diagnosis not present

## 2018-06-13 DIAGNOSIS — F0390 Unspecified dementia without behavioral disturbance: Secondary | ICD-10-CM | POA: Diagnosis not present

## 2018-06-13 DIAGNOSIS — M6281 Muscle weakness (generalized): Secondary | ICD-10-CM | POA: Diagnosis not present

## 2018-06-13 DIAGNOSIS — N184 Chronic kidney disease, stage 4 (severe): Secondary | ICD-10-CM | POA: Diagnosis not present

## 2018-06-13 DIAGNOSIS — D649 Anemia, unspecified: Secondary | ICD-10-CM | POA: Diagnosis not present

## 2018-06-14 DIAGNOSIS — I952 Hypotension due to drugs: Secondary | ICD-10-CM | POA: Diagnosis not present

## 2018-06-14 DIAGNOSIS — H9193 Unspecified hearing loss, bilateral: Secondary | ICD-10-CM | POA: Diagnosis not present

## 2018-06-14 DIAGNOSIS — H6120 Impacted cerumen, unspecified ear: Secondary | ICD-10-CM | POA: Diagnosis not present

## 2018-06-15 DIAGNOSIS — E039 Hypothyroidism, unspecified: Secondary | ICD-10-CM | POA: Diagnosis not present

## 2018-06-15 DIAGNOSIS — E119 Type 2 diabetes mellitus without complications: Secondary | ICD-10-CM | POA: Diagnosis not present

## 2018-06-15 DIAGNOSIS — I10 Essential (primary) hypertension: Secondary | ICD-10-CM | POA: Diagnosis not present

## 2018-06-16 DIAGNOSIS — I129 Hypertensive chronic kidney disease with stage 1 through stage 4 chronic kidney disease, or unspecified chronic kidney disease: Secondary | ICD-10-CM | POA: Diagnosis not present

## 2018-06-16 DIAGNOSIS — M159 Polyosteoarthritis, unspecified: Secondary | ICD-10-CM | POA: Diagnosis not present

## 2018-06-16 DIAGNOSIS — N183 Chronic kidney disease, stage 3 (moderate): Secondary | ICD-10-CM | POA: Diagnosis not present

## 2018-06-16 DIAGNOSIS — I2510 Atherosclerotic heart disease of native coronary artery without angina pectoris: Secondary | ICD-10-CM | POA: Diagnosis not present

## 2018-06-16 DIAGNOSIS — M6281 Muscle weakness (generalized): Secondary | ICD-10-CM | POA: Diagnosis not present

## 2018-06-16 DIAGNOSIS — R296 Repeated falls: Secondary | ICD-10-CM | POA: Diagnosis not present

## 2018-06-20 DIAGNOSIS — D631 Anemia in chronic kidney disease: Secondary | ICD-10-CM | POA: Diagnosis not present

## 2018-06-22 DIAGNOSIS — R296 Repeated falls: Secondary | ICD-10-CM | POA: Diagnosis not present

## 2018-06-22 DIAGNOSIS — M6281 Muscle weakness (generalized): Secondary | ICD-10-CM | POA: Diagnosis not present

## 2018-06-22 DIAGNOSIS — I2510 Atherosclerotic heart disease of native coronary artery without angina pectoris: Secondary | ICD-10-CM | POA: Diagnosis not present

## 2018-06-22 DIAGNOSIS — N183 Chronic kidney disease, stage 3 (moderate): Secondary | ICD-10-CM | POA: Diagnosis not present

## 2018-06-22 DIAGNOSIS — M159 Polyosteoarthritis, unspecified: Secondary | ICD-10-CM | POA: Diagnosis not present

## 2018-06-22 DIAGNOSIS — I129 Hypertensive chronic kidney disease with stage 1 through stage 4 chronic kidney disease, or unspecified chronic kidney disease: Secondary | ICD-10-CM | POA: Diagnosis not present

## 2018-06-24 DIAGNOSIS — Z7982 Long term (current) use of aspirin: Secondary | ICD-10-CM | POA: Diagnosis not present

## 2018-06-24 DIAGNOSIS — M6281 Muscle weakness (generalized): Secondary | ICD-10-CM | POA: Diagnosis not present

## 2018-06-24 DIAGNOSIS — I129 Hypertensive chronic kidney disease with stage 1 through stage 4 chronic kidney disease, or unspecified chronic kidney disease: Secondary | ICD-10-CM | POA: Diagnosis not present

## 2018-06-24 DIAGNOSIS — G894 Chronic pain syndrome: Secondary | ICD-10-CM | POA: Diagnosis not present

## 2018-06-24 DIAGNOSIS — M159 Polyosteoarthritis, unspecified: Secondary | ICD-10-CM | POA: Diagnosis not present

## 2018-06-24 DIAGNOSIS — I2510 Atherosclerotic heart disease of native coronary artery without angina pectoris: Secondary | ICD-10-CM | POA: Diagnosis not present

## 2018-06-24 DIAGNOSIS — F329 Major depressive disorder, single episode, unspecified: Secondary | ICD-10-CM | POA: Diagnosis not present

## 2018-06-24 DIAGNOSIS — R296 Repeated falls: Secondary | ICD-10-CM | POA: Diagnosis not present

## 2018-06-24 DIAGNOSIS — Z9181 History of falling: Secondary | ICD-10-CM | POA: Diagnosis not present

## 2018-06-24 DIAGNOSIS — N183 Chronic kidney disease, stage 3 (moderate): Secondary | ICD-10-CM | POA: Diagnosis not present

## 2018-06-24 DIAGNOSIS — F0390 Unspecified dementia without behavioral disturbance: Secondary | ICD-10-CM | POA: Diagnosis not present

## 2018-06-27 DIAGNOSIS — I10 Essential (primary) hypertension: Secondary | ICD-10-CM | POA: Diagnosis not present

## 2018-06-29 DIAGNOSIS — F0390 Unspecified dementia without behavioral disturbance: Secondary | ICD-10-CM | POA: Diagnosis not present

## 2018-06-29 DIAGNOSIS — I2510 Atherosclerotic heart disease of native coronary artery without angina pectoris: Secondary | ICD-10-CM | POA: Diagnosis not present

## 2018-06-29 DIAGNOSIS — M159 Polyosteoarthritis, unspecified: Secondary | ICD-10-CM | POA: Diagnosis not present

## 2018-06-29 DIAGNOSIS — I129 Hypertensive chronic kidney disease with stage 1 through stage 4 chronic kidney disease, or unspecified chronic kidney disease: Secondary | ICD-10-CM | POA: Diagnosis not present

## 2018-06-29 DIAGNOSIS — G894 Chronic pain syndrome: Secondary | ICD-10-CM | POA: Diagnosis not present

## 2018-06-29 DIAGNOSIS — N183 Chronic kidney disease, stage 3 (moderate): Secondary | ICD-10-CM | POA: Diagnosis not present

## 2018-07-04 DIAGNOSIS — N183 Chronic kidney disease, stage 3 (moderate): Secondary | ICD-10-CM | POA: Diagnosis not present

## 2018-07-04 DIAGNOSIS — I10 Essential (primary) hypertension: Secondary | ICD-10-CM | POA: Diagnosis not present

## 2018-07-06 DIAGNOSIS — I2510 Atherosclerotic heart disease of native coronary artery without angina pectoris: Secondary | ICD-10-CM | POA: Diagnosis not present

## 2018-07-06 DIAGNOSIS — M159 Polyosteoarthritis, unspecified: Secondary | ICD-10-CM | POA: Diagnosis not present

## 2018-07-06 DIAGNOSIS — G894 Chronic pain syndrome: Secondary | ICD-10-CM | POA: Diagnosis not present

## 2018-07-06 DIAGNOSIS — N183 Chronic kidney disease, stage 3 (moderate): Secondary | ICD-10-CM | POA: Diagnosis not present

## 2018-07-06 DIAGNOSIS — I129 Hypertensive chronic kidney disease with stage 1 through stage 4 chronic kidney disease, or unspecified chronic kidney disease: Secondary | ICD-10-CM | POA: Diagnosis not present

## 2018-07-06 DIAGNOSIS — F0390 Unspecified dementia without behavioral disturbance: Secondary | ICD-10-CM | POA: Diagnosis not present

## 2018-07-10 DIAGNOSIS — R600 Localized edema: Secondary | ICD-10-CM | POA: Diagnosis not present

## 2018-07-10 DIAGNOSIS — M6281 Muscle weakness (generalized): Secondary | ICD-10-CM | POA: Diagnosis not present

## 2018-07-10 DIAGNOSIS — L84 Corns and callosities: Secondary | ICD-10-CM | POA: Diagnosis not present

## 2018-07-10 DIAGNOSIS — I739 Peripheral vascular disease, unspecified: Secondary | ICD-10-CM | POA: Diagnosis not present

## 2018-07-10 DIAGNOSIS — R269 Unspecified abnormalities of gait and mobility: Secondary | ICD-10-CM | POA: Diagnosis not present

## 2018-07-10 DIAGNOSIS — G309 Alzheimer's disease, unspecified: Secondary | ICD-10-CM | POA: Diagnosis not present

## 2018-07-10 DIAGNOSIS — L853 Xerosis cutis: Secondary | ICD-10-CM | POA: Diagnosis not present

## 2018-07-13 DIAGNOSIS — G894 Chronic pain syndrome: Secondary | ICD-10-CM | POA: Diagnosis not present

## 2018-07-13 DIAGNOSIS — F0390 Unspecified dementia without behavioral disturbance: Secondary | ICD-10-CM | POA: Diagnosis not present

## 2018-07-13 DIAGNOSIS — D631 Anemia in chronic kidney disease: Secondary | ICD-10-CM | POA: Diagnosis not present

## 2018-07-13 DIAGNOSIS — I129 Hypertensive chronic kidney disease with stage 1 through stage 4 chronic kidney disease, or unspecified chronic kidney disease: Secondary | ICD-10-CM | POA: Diagnosis not present

## 2018-07-13 DIAGNOSIS — N183 Chronic kidney disease, stage 3 (moderate): Secondary | ICD-10-CM | POA: Diagnosis not present

## 2018-07-13 DIAGNOSIS — I2510 Atherosclerotic heart disease of native coronary artery without angina pectoris: Secondary | ICD-10-CM | POA: Diagnosis not present

## 2018-07-13 DIAGNOSIS — M159 Polyosteoarthritis, unspecified: Secondary | ICD-10-CM | POA: Diagnosis not present

## 2018-07-21 DIAGNOSIS — N183 Chronic kidney disease, stage 3 (moderate): Secondary | ICD-10-CM | POA: Diagnosis not present

## 2018-07-21 DIAGNOSIS — I2510 Atherosclerotic heart disease of native coronary artery without angina pectoris: Secondary | ICD-10-CM | POA: Diagnosis not present

## 2018-07-21 DIAGNOSIS — I129 Hypertensive chronic kidney disease with stage 1 through stage 4 chronic kidney disease, or unspecified chronic kidney disease: Secondary | ICD-10-CM | POA: Diagnosis not present

## 2018-07-21 DIAGNOSIS — M159 Polyosteoarthritis, unspecified: Secondary | ICD-10-CM | POA: Diagnosis not present

## 2018-07-21 DIAGNOSIS — G894 Chronic pain syndrome: Secondary | ICD-10-CM | POA: Diagnosis not present

## 2018-07-21 DIAGNOSIS — F0390 Unspecified dementia without behavioral disturbance: Secondary | ICD-10-CM | POA: Diagnosis not present

## 2018-07-24 DIAGNOSIS — F329 Major depressive disorder, single episode, unspecified: Secondary | ICD-10-CM | POA: Diagnosis not present

## 2018-07-24 DIAGNOSIS — N183 Chronic kidney disease, stage 3 (moderate): Secondary | ICD-10-CM | POA: Diagnosis not present

## 2018-07-24 DIAGNOSIS — Z9181 History of falling: Secondary | ICD-10-CM | POA: Diagnosis not present

## 2018-07-24 DIAGNOSIS — G894 Chronic pain syndrome: Secondary | ICD-10-CM | POA: Diagnosis not present

## 2018-07-24 DIAGNOSIS — Z7982 Long term (current) use of aspirin: Secondary | ICD-10-CM | POA: Diagnosis not present

## 2018-07-24 DIAGNOSIS — F0390 Unspecified dementia without behavioral disturbance: Secondary | ICD-10-CM | POA: Diagnosis not present

## 2018-07-24 DIAGNOSIS — I129 Hypertensive chronic kidney disease with stage 1 through stage 4 chronic kidney disease, or unspecified chronic kidney disease: Secondary | ICD-10-CM | POA: Diagnosis not present

## 2018-07-24 DIAGNOSIS — M159 Polyosteoarthritis, unspecified: Secondary | ICD-10-CM | POA: Diagnosis not present

## 2018-07-24 DIAGNOSIS — I2510 Atherosclerotic heart disease of native coronary artery without angina pectoris: Secondary | ICD-10-CM | POA: Diagnosis not present

## 2018-07-25 ENCOUNTER — Ambulatory Visit: Payer: Self-pay | Admitting: Podiatry

## 2018-07-26 DIAGNOSIS — M159 Polyosteoarthritis, unspecified: Secondary | ICD-10-CM | POA: Diagnosis not present

## 2018-07-26 DIAGNOSIS — F0390 Unspecified dementia without behavioral disturbance: Secondary | ICD-10-CM | POA: Diagnosis not present

## 2018-07-26 DIAGNOSIS — I129 Hypertensive chronic kidney disease with stage 1 through stage 4 chronic kidney disease, or unspecified chronic kidney disease: Secondary | ICD-10-CM | POA: Diagnosis not present

## 2018-07-26 DIAGNOSIS — I2510 Atherosclerotic heart disease of native coronary artery without angina pectoris: Secondary | ICD-10-CM | POA: Diagnosis not present

## 2018-07-26 DIAGNOSIS — G894 Chronic pain syndrome: Secondary | ICD-10-CM | POA: Diagnosis not present

## 2018-07-26 DIAGNOSIS — N183 Chronic kidney disease, stage 3 (moderate): Secondary | ICD-10-CM | POA: Diagnosis not present

## 2018-07-27 ENCOUNTER — Other Ambulatory Visit: Payer: Self-pay

## 2018-07-27 ENCOUNTER — Ambulatory Visit (INDEPENDENT_AMBULATORY_CARE_PROVIDER_SITE_OTHER): Payer: Medicare Other | Admitting: Sports Medicine

## 2018-07-27 ENCOUNTER — Encounter: Payer: Self-pay | Admitting: Sports Medicine

## 2018-07-27 VITALS — BP 104/55 | HR 70 | Resp 16

## 2018-07-27 DIAGNOSIS — L89500 Pressure ulcer of unspecified ankle, unstageable: Secondary | ICD-10-CM

## 2018-07-27 DIAGNOSIS — B351 Tinea unguium: Secondary | ICD-10-CM | POA: Diagnosis not present

## 2018-07-27 DIAGNOSIS — M79675 Pain in left toe(s): Secondary | ICD-10-CM

## 2018-07-27 DIAGNOSIS — M79674 Pain in right toe(s): Secondary | ICD-10-CM

## 2018-07-27 DIAGNOSIS — I739 Peripheral vascular disease, unspecified: Secondary | ICD-10-CM

## 2018-07-27 NOTE — Progress Notes (Signed)
Subjective: Aodhan Scheidt is a 85 y.o. male patient seen today in office with complaint of mildly painful thickened and elongated toenails; unable to trim and reports sore spots to both ankles. Patient is from [place] and is unable to give a clear history and does not know exactly why he is coming but does report his toenails have not been trimmed in a long time and has a sore spot to both ankles. Patient denies history of Diabetes, Neuropathy, or Vascular disease. Patient has no other pedal complaints at this time.   Review of Systems  Unable to perform ROS: Other  HENT: Positive for hearing loss.      There are no active problems to display for this patient.   Current Outpatient Medications on File Prior to Visit  Medication Sig Dispense Refill  . acetaminophen (TYLENOL) 500 MG tablet Take 500 mg by mouth every 6 (six) hours as needed.    Marland Kitchen aspirin EC 81 MG tablet Take 81 mg by mouth daily.    . ferrous sulfate 325 (65 FE) MG tablet Take 325 mg by mouth daily with breakfast.     No current facility-administered medications on file prior to visit.     Not on File  Objective: Physical Exam  General: Well developed, nourished, no acute distress, awake, alert and oriented x 2 in wheelchair  Vascular: Dorsalis pedis artery 1/4 bilateral, Posterior tibial artery 0/4 bilateral, skin temperature warm to warm proximal to distal bilateral lower extremities, no varicosities, pedal hair present bilateral.  Neurological: Gross sensation present via light touch bilateral.   Dermatological: Skin is warm, dry, and supple bilateral, Nails 1-10 are tender, long, thick, and discolored with mild subungal debris, no webspace macerations present bilateral, no open lesions present bilateral however there is hyperpigmentation at right fibula and left tibia/malleous likely from pressure from bed position, no callus/corns/hyperkeratotic tissue present bilateral. No signs of infection  bilateral.  Musculoskeletal: Asymptomatic bunion and hammertoe boney deformities noted bilateral. Muscular strength within 4/5 without painon range of motion. Does not walk much uses wheelchair. No pain with calf compression bilateral.  Assessment and Plan:  Problem List Items Addressed This Visit    None    Visit Diagnoses    Pain due to onychomycosis of toenails of both feet    -  Primary   Pressure injury of ankle, unstageable, unspecified laterality (HCC)       PVD (peripheral vascular disease) ([place])       Relevant Medications   aspirin EC 81 MG tablet      -Examined patient.  -Discussed treatment options for painful mycotic nails and pressure sores  -Mechanically debrided and reduced mycotic nails with sterile nail nipper and dremel nail file without incident. -Recommend protective bandaids and pillow offloading to crossroads to prevent ulceration at areas of pressure  -Patient to return in 3 months for follow up evaluation or sooner if symptoms worsen.  Landis Martins, DPM

## 2018-07-27 NOTE — Progress Notes (Signed)
   Subjective:    Patient ID: Gabriel Hunter, male    DOB: [birth year], 85 y.o.   MRN: [id number]  HPI    Review of Systems  HENT: Positive for hearing loss.   All other systems reviewed and are negative.      Objective:   Physical Exam        Assessment & Plan:

## 2018-08-17 DIAGNOSIS — M159 Polyosteoarthritis, unspecified: Secondary | ICD-10-CM | POA: Diagnosis not present

## 2018-08-17 DIAGNOSIS — F0390 Unspecified dementia without behavioral disturbance: Secondary | ICD-10-CM | POA: Diagnosis not present

## 2018-08-17 DIAGNOSIS — I2510 Atherosclerotic heart disease of native coronary artery without angina pectoris: Secondary | ICD-10-CM | POA: Diagnosis not present

## 2018-08-17 DIAGNOSIS — N183 Chronic kidney disease, stage 3 (moderate): Secondary | ICD-10-CM | POA: Diagnosis not present

## 2018-08-17 DIAGNOSIS — I129 Hypertensive chronic kidney disease with stage 1 through stage 4 chronic kidney disease, or unspecified chronic kidney disease: Secondary | ICD-10-CM | POA: Diagnosis not present

## 2018-08-17 DIAGNOSIS — D649 Anemia, unspecified: Secondary | ICD-10-CM | POA: Diagnosis not present

## 2018-08-17 DIAGNOSIS — G894 Chronic pain syndrome: Secondary | ICD-10-CM | POA: Diagnosis not present

## 2018-08-23 DIAGNOSIS — G894 Chronic pain syndrome: Secondary | ICD-10-CM | POA: Diagnosis not present

## 2018-08-23 DIAGNOSIS — N183 Chronic kidney disease, stage 3 (moderate): Secondary | ICD-10-CM | POA: Diagnosis not present

## 2018-08-23 DIAGNOSIS — I129 Hypertensive chronic kidney disease with stage 1 through stage 4 chronic kidney disease, or unspecified chronic kidney disease: Secondary | ICD-10-CM | POA: Diagnosis not present

## 2018-08-23 DIAGNOSIS — M159 Polyosteoarthritis, unspecified: Secondary | ICD-10-CM | POA: Diagnosis not present

## 2018-08-23 DIAGNOSIS — Z7982 Long term (current) use of aspirin: Secondary | ICD-10-CM | POA: Diagnosis not present

## 2018-08-23 DIAGNOSIS — I2510 Atherosclerotic heart disease of native coronary artery without angina pectoris: Secondary | ICD-10-CM | POA: Diagnosis not present

## 2018-08-23 DIAGNOSIS — F329 Major depressive disorder, single episode, unspecified: Secondary | ICD-10-CM | POA: Diagnosis not present

## 2018-08-23 DIAGNOSIS — F0390 Unspecified dementia without behavioral disturbance: Secondary | ICD-10-CM | POA: Diagnosis not present

## 2018-08-23 DIAGNOSIS — Z9181 History of falling: Secondary | ICD-10-CM | POA: Diagnosis not present

## 2018-08-30 DIAGNOSIS — I2510 Atherosclerotic heart disease of native coronary artery without angina pectoris: Secondary | ICD-10-CM | POA: Diagnosis not present

## 2018-08-30 DIAGNOSIS — N183 Chronic kidney disease, stage 3 (moderate): Secondary | ICD-10-CM | POA: Diagnosis not present

## 2018-08-30 DIAGNOSIS — F0390 Unspecified dementia without behavioral disturbance: Secondary | ICD-10-CM | POA: Diagnosis not present

## 2018-08-30 DIAGNOSIS — M159 Polyosteoarthritis, unspecified: Secondary | ICD-10-CM | POA: Diagnosis not present

## 2018-08-30 DIAGNOSIS — I129 Hypertensive chronic kidney disease with stage 1 through stage 4 chronic kidney disease, or unspecified chronic kidney disease: Secondary | ICD-10-CM | POA: Diagnosis not present

## 2018-08-30 DIAGNOSIS — G894 Chronic pain syndrome: Secondary | ICD-10-CM | POA: Diagnosis not present

## 2018-09-05 DIAGNOSIS — M79601 Pain in right arm: Secondary | ICD-10-CM | POA: Diagnosis not present

## 2018-09-05 DIAGNOSIS — R600 Localized edema: Secondary | ICD-10-CM | POA: Diagnosis not present

## 2018-09-05 DIAGNOSIS — G309 Alzheimer's disease, unspecified: Secondary | ICD-10-CM | POA: Diagnosis not present

## 2018-09-07 DIAGNOSIS — I129 Hypertensive chronic kidney disease with stage 1 through stage 4 chronic kidney disease, or unspecified chronic kidney disease: Secondary | ICD-10-CM | POA: Diagnosis not present

## 2018-09-07 DIAGNOSIS — I2510 Atherosclerotic heart disease of native coronary artery without angina pectoris: Secondary | ICD-10-CM | POA: Diagnosis not present

## 2018-09-07 DIAGNOSIS — M159 Polyosteoarthritis, unspecified: Secondary | ICD-10-CM | POA: Diagnosis not present

## 2018-09-07 DIAGNOSIS — G894 Chronic pain syndrome: Secondary | ICD-10-CM | POA: Diagnosis not present

## 2018-09-07 DIAGNOSIS — N183 Chronic kidney disease, stage 3 (moderate): Secondary | ICD-10-CM | POA: Diagnosis not present

## 2018-09-07 DIAGNOSIS — F0390 Unspecified dementia without behavioral disturbance: Secondary | ICD-10-CM | POA: Diagnosis not present

## 2018-09-14 DIAGNOSIS — F0390 Unspecified dementia without behavioral disturbance: Secondary | ICD-10-CM | POA: Diagnosis not present

## 2018-09-14 DIAGNOSIS — G894 Chronic pain syndrome: Secondary | ICD-10-CM | POA: Diagnosis not present

## 2018-09-14 DIAGNOSIS — N183 Chronic kidney disease, stage 3 (moderate): Secondary | ICD-10-CM | POA: Diagnosis not present

## 2018-09-14 DIAGNOSIS — D631 Anemia in chronic kidney disease: Secondary | ICD-10-CM | POA: Diagnosis not present

## 2018-09-14 DIAGNOSIS — I129 Hypertensive chronic kidney disease with stage 1 through stage 4 chronic kidney disease, or unspecified chronic kidney disease: Secondary | ICD-10-CM | POA: Diagnosis not present

## 2018-09-14 DIAGNOSIS — M159 Polyosteoarthritis, unspecified: Secondary | ICD-10-CM | POA: Diagnosis not present

## 2018-09-14 DIAGNOSIS — I2510 Atherosclerotic heart disease of native coronary artery without angina pectoris: Secondary | ICD-10-CM | POA: Diagnosis not present

## 2018-09-18 DIAGNOSIS — R5381 Other malaise: Secondary | ICD-10-CM | POA: Diagnosis not present

## 2018-09-18 DIAGNOSIS — Z87440 Personal history of urinary (tract) infections: Secondary | ICD-10-CM | POA: Diagnosis not present

## 2018-09-18 DIAGNOSIS — N390 Urinary tract infection, site not specified: Secondary | ICD-10-CM | POA: Diagnosis not present

## 2018-09-18 DIAGNOSIS — G309 Alzheimer's disease, unspecified: Secondary | ICD-10-CM | POA: Diagnosis not present

## 2018-09-18 DIAGNOSIS — R350 Frequency of micturition: Secondary | ICD-10-CM | POA: Diagnosis not present

## 2018-09-18 DIAGNOSIS — R1030 Lower abdominal pain, unspecified: Secondary | ICD-10-CM | POA: Diagnosis not present

## 2018-09-20 DIAGNOSIS — F339 Major depressive disorder, recurrent, unspecified: Secondary | ICD-10-CM | POA: Diagnosis not present

## 2018-09-20 DIAGNOSIS — E0590 Thyrotoxicosis, unspecified without thyrotoxic crisis or storm: Secondary | ICD-10-CM | POA: Diagnosis not present

## 2018-09-20 DIAGNOSIS — K5900 Constipation, unspecified: Secondary | ICD-10-CM | POA: Diagnosis not present

## 2018-09-20 DIAGNOSIS — E559 Vitamin D deficiency, unspecified: Secondary | ICD-10-CM | POA: Diagnosis not present

## 2018-09-20 DIAGNOSIS — I2510 Atherosclerotic heart disease of native coronary artery without angina pectoris: Secondary | ICD-10-CM | POA: Diagnosis not present

## 2018-09-20 DIAGNOSIS — D509 Iron deficiency anemia, unspecified: Secondary | ICD-10-CM | POA: Diagnosis not present

## 2018-09-20 DIAGNOSIS — K219 Gastro-esophageal reflux disease without esophagitis: Secondary | ICD-10-CM | POA: Diagnosis not present

## 2018-09-20 DIAGNOSIS — N400 Enlarged prostate without lower urinary tract symptoms: Secondary | ICD-10-CM | POA: Diagnosis not present

## 2018-09-20 DIAGNOSIS — I10 Essential (primary) hypertension: Secondary | ICD-10-CM | POA: Diagnosis not present

## 2018-09-20 DIAGNOSIS — G894 Chronic pain syndrome: Secondary | ICD-10-CM | POA: Diagnosis not present

## 2018-09-20 DIAGNOSIS — F0390 Unspecified dementia without behavioral disturbance: Secondary | ICD-10-CM | POA: Diagnosis not present

## 2018-09-21 DIAGNOSIS — I129 Hypertensive chronic kidney disease with stage 1 through stage 4 chronic kidney disease, or unspecified chronic kidney disease: Secondary | ICD-10-CM | POA: Diagnosis not present

## 2018-09-21 DIAGNOSIS — M159 Polyosteoarthritis, unspecified: Secondary | ICD-10-CM | POA: Diagnosis not present

## 2018-09-21 DIAGNOSIS — F0390 Unspecified dementia without behavioral disturbance: Secondary | ICD-10-CM | POA: Diagnosis not present

## 2018-09-21 DIAGNOSIS — N183 Chronic kidney disease, stage 3 (moderate): Secondary | ICD-10-CM | POA: Diagnosis not present

## 2018-09-21 DIAGNOSIS — G894 Chronic pain syndrome: Secondary | ICD-10-CM | POA: Diagnosis not present

## 2018-09-21 DIAGNOSIS — I2510 Atherosclerotic heart disease of native coronary artery without angina pectoris: Secondary | ICD-10-CM | POA: Diagnosis not present

## 2018-09-22 DIAGNOSIS — N183 Chronic kidney disease, stage 3 (moderate): Secondary | ICD-10-CM | POA: Diagnosis not present

## 2018-09-22 DIAGNOSIS — Z9181 History of falling: Secondary | ICD-10-CM | POA: Diagnosis not present

## 2018-09-22 DIAGNOSIS — M159 Polyosteoarthritis, unspecified: Secondary | ICD-10-CM | POA: Diagnosis not present

## 2018-09-22 DIAGNOSIS — F329 Major depressive disorder, single episode, unspecified: Secondary | ICD-10-CM | POA: Diagnosis not present

## 2018-09-22 DIAGNOSIS — I129 Hypertensive chronic kidney disease with stage 1 through stage 4 chronic kidney disease, or unspecified chronic kidney disease: Secondary | ICD-10-CM | POA: Diagnosis not present

## 2018-09-22 DIAGNOSIS — I2510 Atherosclerotic heart disease of native coronary artery without angina pectoris: Secondary | ICD-10-CM | POA: Diagnosis not present

## 2018-09-22 DIAGNOSIS — F0390 Unspecified dementia without behavioral disturbance: Secondary | ICD-10-CM | POA: Diagnosis not present

## 2018-09-22 DIAGNOSIS — Z7982 Long term (current) use of aspirin: Secondary | ICD-10-CM | POA: Diagnosis not present

## 2018-09-22 DIAGNOSIS — G894 Chronic pain syndrome: Secondary | ICD-10-CM | POA: Diagnosis not present

## 2018-09-28 DIAGNOSIS — N183 Chronic kidney disease, stage 3 (moderate): Secondary | ICD-10-CM | POA: Diagnosis not present

## 2018-09-28 DIAGNOSIS — M159 Polyosteoarthritis, unspecified: Secondary | ICD-10-CM | POA: Diagnosis not present

## 2018-09-28 DIAGNOSIS — G894 Chronic pain syndrome: Secondary | ICD-10-CM | POA: Diagnosis not present

## 2018-09-28 DIAGNOSIS — F0390 Unspecified dementia without behavioral disturbance: Secondary | ICD-10-CM | POA: Diagnosis not present

## 2018-09-28 DIAGNOSIS — I129 Hypertensive chronic kidney disease with stage 1 through stage 4 chronic kidney disease, or unspecified chronic kidney disease: Secondary | ICD-10-CM | POA: Diagnosis not present

## 2018-09-28 DIAGNOSIS — I2510 Atherosclerotic heart disease of native coronary artery without angina pectoris: Secondary | ICD-10-CM | POA: Diagnosis not present

## 2018-10-02 DIAGNOSIS — G4700 Insomnia, unspecified: Secondary | ICD-10-CM | POA: Diagnosis not present

## 2018-10-02 DIAGNOSIS — F0390 Unspecified dementia without behavioral disturbance: Secondary | ICD-10-CM | POA: Diagnosis not present

## 2018-10-04 DIAGNOSIS — I2510 Atherosclerotic heart disease of native coronary artery without angina pectoris: Secondary | ICD-10-CM | POA: Diagnosis not present

## 2018-10-04 DIAGNOSIS — I129 Hypertensive chronic kidney disease with stage 1 through stage 4 chronic kidney disease, or unspecified chronic kidney disease: Secondary | ICD-10-CM | POA: Diagnosis not present

## 2018-10-04 DIAGNOSIS — M159 Polyosteoarthritis, unspecified: Secondary | ICD-10-CM | POA: Diagnosis not present

## 2018-10-04 DIAGNOSIS — N183 Chronic kidney disease, stage 3 (moderate): Secondary | ICD-10-CM | POA: Diagnosis not present

## 2018-10-04 DIAGNOSIS — F0390 Unspecified dementia without behavioral disturbance: Secondary | ICD-10-CM | POA: Diagnosis not present

## 2018-10-04 DIAGNOSIS — G894 Chronic pain syndrome: Secondary | ICD-10-CM | POA: Diagnosis not present

## 2018-10-10 DIAGNOSIS — F0390 Unspecified dementia without behavioral disturbance: Secondary | ICD-10-CM | POA: Diagnosis not present

## 2018-10-10 DIAGNOSIS — R635 Abnormal weight gain: Secondary | ICD-10-CM | POA: Diagnosis not present

## 2018-10-10 DIAGNOSIS — R600 Localized edema: Secondary | ICD-10-CM | POA: Diagnosis not present

## 2018-10-12 DIAGNOSIS — I2510 Atherosclerotic heart disease of native coronary artery without angina pectoris: Secondary | ICD-10-CM | POA: Diagnosis not present

## 2018-10-12 DIAGNOSIS — N183 Chronic kidney disease, stage 3 (moderate): Secondary | ICD-10-CM | POA: Diagnosis not present

## 2018-10-12 DIAGNOSIS — N189 Chronic kidney disease, unspecified: Secondary | ICD-10-CM | POA: Diagnosis not present

## 2018-10-12 DIAGNOSIS — D631 Anemia in chronic kidney disease: Secondary | ICD-10-CM | POA: Diagnosis not present

## 2018-10-12 DIAGNOSIS — M159 Polyosteoarthritis, unspecified: Secondary | ICD-10-CM | POA: Diagnosis not present

## 2018-10-12 DIAGNOSIS — F0390 Unspecified dementia without behavioral disturbance: Secondary | ICD-10-CM | POA: Diagnosis not present

## 2018-10-12 DIAGNOSIS — G894 Chronic pain syndrome: Secondary | ICD-10-CM | POA: Diagnosis not present

## 2018-10-12 DIAGNOSIS — I129 Hypertensive chronic kidney disease with stage 1 through stage 4 chronic kidney disease, or unspecified chronic kidney disease: Secondary | ICD-10-CM | POA: Diagnosis not present

## 2018-10-19 DIAGNOSIS — I2510 Atherosclerotic heart disease of native coronary artery without angina pectoris: Secondary | ICD-10-CM | POA: Diagnosis not present

## 2018-10-19 DIAGNOSIS — F0390 Unspecified dementia without behavioral disturbance: Secondary | ICD-10-CM | POA: Diagnosis not present

## 2018-10-19 DIAGNOSIS — M159 Polyosteoarthritis, unspecified: Secondary | ICD-10-CM | POA: Diagnosis not present

## 2018-10-19 DIAGNOSIS — N183 Chronic kidney disease, stage 3 (moderate): Secondary | ICD-10-CM | POA: Diagnosis not present

## 2018-10-19 DIAGNOSIS — I129 Hypertensive chronic kidney disease with stage 1 through stage 4 chronic kidney disease, or unspecified chronic kidney disease: Secondary | ICD-10-CM | POA: Diagnosis not present

## 2018-10-19 DIAGNOSIS — G894 Chronic pain syndrome: Secondary | ICD-10-CM | POA: Diagnosis not present

## 2018-10-22 DIAGNOSIS — F0390 Unspecified dementia without behavioral disturbance: Secondary | ICD-10-CM | POA: Diagnosis not present

## 2018-10-22 DIAGNOSIS — I129 Hypertensive chronic kidney disease with stage 1 through stage 4 chronic kidney disease, or unspecified chronic kidney disease: Secondary | ICD-10-CM | POA: Diagnosis not present

## 2018-10-22 DIAGNOSIS — I2510 Atherosclerotic heart disease of native coronary artery without angina pectoris: Secondary | ICD-10-CM | POA: Diagnosis not present

## 2018-10-22 DIAGNOSIS — F329 Major depressive disorder, single episode, unspecified: Secondary | ICD-10-CM | POA: Diagnosis not present

## 2018-10-22 DIAGNOSIS — M159 Polyosteoarthritis, unspecified: Secondary | ICD-10-CM | POA: Diagnosis not present

## 2018-10-22 DIAGNOSIS — Z7982 Long term (current) use of aspirin: Secondary | ICD-10-CM | POA: Diagnosis not present

## 2018-10-22 DIAGNOSIS — N183 Chronic kidney disease, stage 3 (moderate): Secondary | ICD-10-CM | POA: Diagnosis not present

## 2018-10-22 DIAGNOSIS — G894 Chronic pain syndrome: Secondary | ICD-10-CM | POA: Diagnosis not present

## 2018-10-22 DIAGNOSIS — Z9181 History of falling: Secondary | ICD-10-CM | POA: Diagnosis not present

## 2018-10-26 ENCOUNTER — Ambulatory Visit: Payer: Medicare Other | Admitting: Sports Medicine

## 2018-10-26 DIAGNOSIS — N183 Chronic kidney disease, stage 3 (moderate): Secondary | ICD-10-CM | POA: Diagnosis not present

## 2018-10-26 DIAGNOSIS — I2510 Atherosclerotic heart disease of native coronary artery without angina pectoris: Secondary | ICD-10-CM | POA: Diagnosis not present

## 2018-10-26 DIAGNOSIS — I129 Hypertensive chronic kidney disease with stage 1 through stage 4 chronic kidney disease, or unspecified chronic kidney disease: Secondary | ICD-10-CM | POA: Diagnosis not present

## 2018-10-26 DIAGNOSIS — G894 Chronic pain syndrome: Secondary | ICD-10-CM | POA: Diagnosis not present

## 2018-10-26 DIAGNOSIS — M159 Polyosteoarthritis, unspecified: Secondary | ICD-10-CM | POA: Diagnosis not present

## 2018-10-26 DIAGNOSIS — F0390 Unspecified dementia without behavioral disturbance: Secondary | ICD-10-CM | POA: Diagnosis not present

## 2018-11-02 DIAGNOSIS — G894 Chronic pain syndrome: Secondary | ICD-10-CM | POA: Diagnosis not present

## 2018-11-02 DIAGNOSIS — M159 Polyosteoarthritis, unspecified: Secondary | ICD-10-CM | POA: Diagnosis not present

## 2018-11-02 DIAGNOSIS — I2510 Atherosclerotic heart disease of native coronary artery without angina pectoris: Secondary | ICD-10-CM | POA: Diagnosis not present

## 2018-11-02 DIAGNOSIS — I129 Hypertensive chronic kidney disease with stage 1 through stage 4 chronic kidney disease, or unspecified chronic kidney disease: Secondary | ICD-10-CM | POA: Diagnosis not present

## 2018-11-02 DIAGNOSIS — F0390 Unspecified dementia without behavioral disturbance: Secondary | ICD-10-CM | POA: Diagnosis not present

## 2018-11-02 DIAGNOSIS — N183 Chronic kidney disease, stage 3 (moderate): Secondary | ICD-10-CM | POA: Diagnosis not present

## 2018-11-09 DIAGNOSIS — G894 Chronic pain syndrome: Secondary | ICD-10-CM | POA: Diagnosis not present

## 2018-11-09 DIAGNOSIS — I2510 Atherosclerotic heart disease of native coronary artery without angina pectoris: Secondary | ICD-10-CM | POA: Diagnosis not present

## 2018-11-09 DIAGNOSIS — N183 Chronic kidney disease, stage 3 (moderate): Secondary | ICD-10-CM | POA: Diagnosis not present

## 2018-11-09 DIAGNOSIS — F0390 Unspecified dementia without behavioral disturbance: Secondary | ICD-10-CM | POA: Diagnosis not present

## 2018-11-09 DIAGNOSIS — M159 Polyosteoarthritis, unspecified: Secondary | ICD-10-CM | POA: Diagnosis not present

## 2018-11-09 DIAGNOSIS — I129 Hypertensive chronic kidney disease with stage 1 through stage 4 chronic kidney disease, or unspecified chronic kidney disease: Secondary | ICD-10-CM | POA: Diagnosis not present

## 2018-11-15 DIAGNOSIS — I10 Essential (primary) hypertension: Secondary | ICD-10-CM | POA: Diagnosis not present

## 2018-11-15 DIAGNOSIS — N183 Chronic kidney disease, stage 3 (moderate): Secondary | ICD-10-CM | POA: Diagnosis not present

## 2018-11-16 DIAGNOSIS — F0390 Unspecified dementia without behavioral disturbance: Secondary | ICD-10-CM | POA: Diagnosis not present

## 2018-11-16 DIAGNOSIS — M159 Polyosteoarthritis, unspecified: Secondary | ICD-10-CM | POA: Diagnosis not present

## 2018-11-16 DIAGNOSIS — D510 Vitamin B12 deficiency anemia due to intrinsic factor deficiency: Secondary | ICD-10-CM | POA: Diagnosis not present

## 2018-11-16 DIAGNOSIS — I129 Hypertensive chronic kidney disease with stage 1 through stage 4 chronic kidney disease, or unspecified chronic kidney disease: Secondary | ICD-10-CM | POA: Diagnosis not present

## 2018-11-16 DIAGNOSIS — N183 Chronic kidney disease, stage 3 (moderate): Secondary | ICD-10-CM | POA: Diagnosis not present

## 2018-11-16 DIAGNOSIS — G894 Chronic pain syndrome: Secondary | ICD-10-CM | POA: Diagnosis not present

## 2018-11-16 DIAGNOSIS — I2510 Atherosclerotic heart disease of native coronary artery without angina pectoris: Secondary | ICD-10-CM | POA: Diagnosis not present

## 2018-11-16 DIAGNOSIS — D649 Anemia, unspecified: Secondary | ICD-10-CM | POA: Diagnosis not present

## 2018-11-21 DIAGNOSIS — M159 Polyosteoarthritis, unspecified: Secondary | ICD-10-CM | POA: Diagnosis not present

## 2018-11-21 DIAGNOSIS — I2510 Atherosclerotic heart disease of native coronary artery without angina pectoris: Secondary | ICD-10-CM | POA: Diagnosis not present

## 2018-11-21 DIAGNOSIS — I129 Hypertensive chronic kidney disease with stage 1 through stage 4 chronic kidney disease, or unspecified chronic kidney disease: Secondary | ICD-10-CM | POA: Diagnosis not present

## 2018-11-21 DIAGNOSIS — N183 Chronic kidney disease, stage 3 (moderate): Secondary | ICD-10-CM | POA: Diagnosis not present

## 2018-11-21 DIAGNOSIS — Z7982 Long term (current) use of aspirin: Secondary | ICD-10-CM | POA: Diagnosis not present

## 2018-11-21 DIAGNOSIS — F329 Major depressive disorder, single episode, unspecified: Secondary | ICD-10-CM | POA: Diagnosis not present

## 2018-11-21 DIAGNOSIS — Z9181 History of falling: Secondary | ICD-10-CM | POA: Diagnosis not present

## 2018-11-21 DIAGNOSIS — G894 Chronic pain syndrome: Secondary | ICD-10-CM | POA: Diagnosis not present

## 2018-11-21 DIAGNOSIS — F0390 Unspecified dementia without behavioral disturbance: Secondary | ICD-10-CM | POA: Diagnosis not present

## 2018-11-23 DIAGNOSIS — N183 Chronic kidney disease, stage 3 (moderate): Secondary | ICD-10-CM | POA: Diagnosis not present

## 2018-11-23 DIAGNOSIS — I2510 Atherosclerotic heart disease of native coronary artery without angina pectoris: Secondary | ICD-10-CM | POA: Diagnosis not present

## 2018-11-23 DIAGNOSIS — M159 Polyosteoarthritis, unspecified: Secondary | ICD-10-CM | POA: Diagnosis not present

## 2018-11-23 DIAGNOSIS — G894 Chronic pain syndrome: Secondary | ICD-10-CM | POA: Diagnosis not present

## 2018-11-23 DIAGNOSIS — F0390 Unspecified dementia without behavioral disturbance: Secondary | ICD-10-CM | POA: Diagnosis not present

## 2018-11-23 DIAGNOSIS — I129 Hypertensive chronic kidney disease with stage 1 through stage 4 chronic kidney disease, or unspecified chronic kidney disease: Secondary | ICD-10-CM | POA: Diagnosis not present

## 2018-11-30 DIAGNOSIS — I129 Hypertensive chronic kidney disease with stage 1 through stage 4 chronic kidney disease, or unspecified chronic kidney disease: Secondary | ICD-10-CM | POA: Diagnosis not present

## 2018-11-30 DIAGNOSIS — F0390 Unspecified dementia without behavioral disturbance: Secondary | ICD-10-CM | POA: Diagnosis not present

## 2018-11-30 DIAGNOSIS — G894 Chronic pain syndrome: Secondary | ICD-10-CM | POA: Diagnosis not present

## 2018-11-30 DIAGNOSIS — I2510 Atherosclerotic heart disease of native coronary artery without angina pectoris: Secondary | ICD-10-CM | POA: Diagnosis not present

## 2018-11-30 DIAGNOSIS — N183 Chronic kidney disease, stage 3 (moderate): Secondary | ICD-10-CM | POA: Diagnosis not present

## 2018-11-30 DIAGNOSIS — M159 Polyosteoarthritis, unspecified: Secondary | ICD-10-CM | POA: Diagnosis not present

## 2018-12-08 DIAGNOSIS — M159 Polyosteoarthritis, unspecified: Secondary | ICD-10-CM | POA: Diagnosis not present

## 2018-12-08 DIAGNOSIS — G894 Chronic pain syndrome: Secondary | ICD-10-CM | POA: Diagnosis not present

## 2018-12-08 DIAGNOSIS — I129 Hypertensive chronic kidney disease with stage 1 through stage 4 chronic kidney disease, or unspecified chronic kidney disease: Secondary | ICD-10-CM | POA: Diagnosis not present

## 2018-12-08 DIAGNOSIS — N183 Chronic kidney disease, stage 3 (moderate): Secondary | ICD-10-CM | POA: Diagnosis not present

## 2018-12-08 DIAGNOSIS — I2510 Atherosclerotic heart disease of native coronary artery without angina pectoris: Secondary | ICD-10-CM | POA: Diagnosis not present

## 2018-12-08 DIAGNOSIS — F0390 Unspecified dementia without behavioral disturbance: Secondary | ICD-10-CM | POA: Diagnosis not present

## 2018-12-14 DIAGNOSIS — F0390 Unspecified dementia without behavioral disturbance: Secondary | ICD-10-CM | POA: Diagnosis not present

## 2018-12-14 DIAGNOSIS — G894 Chronic pain syndrome: Secondary | ICD-10-CM | POA: Diagnosis not present

## 2018-12-14 DIAGNOSIS — I2510 Atherosclerotic heart disease of native coronary artery without angina pectoris: Secondary | ICD-10-CM | POA: Diagnosis not present

## 2018-12-14 DIAGNOSIS — N183 Chronic kidney disease, stage 3 (moderate): Secondary | ICD-10-CM | POA: Diagnosis not present

## 2018-12-14 DIAGNOSIS — D631 Anemia in chronic kidney disease: Secondary | ICD-10-CM | POA: Diagnosis not present

## 2018-12-14 DIAGNOSIS — M159 Polyosteoarthritis, unspecified: Secondary | ICD-10-CM | POA: Diagnosis not present

## 2018-12-14 DIAGNOSIS — I129 Hypertensive chronic kidney disease with stage 1 through stage 4 chronic kidney disease, or unspecified chronic kidney disease: Secondary | ICD-10-CM | POA: Diagnosis not present

## 2018-12-20 ENCOUNTER — Other Ambulatory Visit: Payer: Self-pay

## 2018-12-20 DIAGNOSIS — G894 Chronic pain syndrome: Secondary | ICD-10-CM | POA: Diagnosis not present

## 2018-12-20 DIAGNOSIS — M159 Polyosteoarthritis, unspecified: Secondary | ICD-10-CM | POA: Diagnosis not present

## 2018-12-20 DIAGNOSIS — I2510 Atherosclerotic heart disease of native coronary artery without angina pectoris: Secondary | ICD-10-CM | POA: Diagnosis not present

## 2018-12-20 DIAGNOSIS — I129 Hypertensive chronic kidney disease with stage 1 through stage 4 chronic kidney disease, or unspecified chronic kidney disease: Secondary | ICD-10-CM | POA: Diagnosis not present

## 2018-12-20 DIAGNOSIS — N183 Chronic kidney disease, stage 3 (moderate): Secondary | ICD-10-CM | POA: Diagnosis not present

## 2018-12-20 DIAGNOSIS — F0390 Unspecified dementia without behavioral disturbance: Secondary | ICD-10-CM | POA: Diagnosis not present

## 2018-12-21 DIAGNOSIS — F329 Major depressive disorder, single episode, unspecified: Secondary | ICD-10-CM | POA: Diagnosis not present

## 2018-12-21 DIAGNOSIS — G894 Chronic pain syndrome: Secondary | ICD-10-CM | POA: Diagnosis not present

## 2018-12-21 DIAGNOSIS — Z7982 Long term (current) use of aspirin: Secondary | ICD-10-CM | POA: Diagnosis not present

## 2018-12-21 DIAGNOSIS — Z9181 History of falling: Secondary | ICD-10-CM | POA: Diagnosis not present

## 2018-12-21 DIAGNOSIS — F0390 Unspecified dementia without behavioral disturbance: Secondary | ICD-10-CM | POA: Diagnosis not present

## 2018-12-21 DIAGNOSIS — M159 Polyosteoarthritis, unspecified: Secondary | ICD-10-CM | POA: Diagnosis not present

## 2018-12-21 DIAGNOSIS — I129 Hypertensive chronic kidney disease with stage 1 through stage 4 chronic kidney disease, or unspecified chronic kidney disease: Secondary | ICD-10-CM | POA: Diagnosis not present

## 2018-12-21 DIAGNOSIS — I2510 Atherosclerotic heart disease of native coronary artery without angina pectoris: Secondary | ICD-10-CM | POA: Diagnosis not present

## 2018-12-21 DIAGNOSIS — N183 Chronic kidney disease, stage 3 (moderate): Secondary | ICD-10-CM | POA: Diagnosis not present

## 2018-12-27 DIAGNOSIS — K219 Gastro-esophageal reflux disease without esophagitis: Secondary | ICD-10-CM | POA: Diagnosis not present

## 2018-12-27 DIAGNOSIS — F0390 Unspecified dementia without behavioral disturbance: Secondary | ICD-10-CM | POA: Diagnosis not present

## 2018-12-27 DIAGNOSIS — F331 Major depressive disorder, recurrent, moderate: Secondary | ICD-10-CM | POA: Diagnosis not present

## 2018-12-27 DIAGNOSIS — N184 Chronic kidney disease, stage 4 (severe): Secondary | ICD-10-CM | POA: Diagnosis not present

## 2018-12-27 DIAGNOSIS — I2510 Atherosclerotic heart disease of native coronary artery without angina pectoris: Secondary | ICD-10-CM | POA: Diagnosis not present

## 2018-12-27 DIAGNOSIS — N400 Enlarged prostate without lower urinary tract symptoms: Secondary | ICD-10-CM | POA: Diagnosis not present

## 2018-12-27 DIAGNOSIS — E559 Vitamin D deficiency, unspecified: Secondary | ICD-10-CM | POA: Diagnosis not present

## 2018-12-27 DIAGNOSIS — E0590 Thyrotoxicosis, unspecified without thyrotoxic crisis or storm: Secondary | ICD-10-CM | POA: Diagnosis not present

## 2018-12-27 DIAGNOSIS — K5900 Constipation, unspecified: Secondary | ICD-10-CM | POA: Diagnosis not present

## 2018-12-27 DIAGNOSIS — G894 Chronic pain syndrome: Secondary | ICD-10-CM | POA: Diagnosis not present

## 2018-12-27 DIAGNOSIS — D509 Iron deficiency anemia, unspecified: Secondary | ICD-10-CM | POA: Diagnosis not present

## 2018-12-27 DIAGNOSIS — I129 Hypertensive chronic kidney disease with stage 1 through stage 4 chronic kidney disease, or unspecified chronic kidney disease: Secondary | ICD-10-CM | POA: Diagnosis not present

## 2018-12-28 DIAGNOSIS — E785 Hyperlipidemia, unspecified: Secondary | ICD-10-CM | POA: Diagnosis not present

## 2018-12-28 DIAGNOSIS — E119 Type 2 diabetes mellitus without complications: Secondary | ICD-10-CM | POA: Diagnosis not present

## 2018-12-28 DIAGNOSIS — E039 Hypothyroidism, unspecified: Secondary | ICD-10-CM | POA: Diagnosis not present

## 2019-01-04 DIAGNOSIS — M159 Polyosteoarthritis, unspecified: Secondary | ICD-10-CM | POA: Diagnosis not present

## 2019-01-04 DIAGNOSIS — N183 Chronic kidney disease, stage 3 (moderate): Secondary | ICD-10-CM | POA: Diagnosis not present

## 2019-01-04 DIAGNOSIS — I2510 Atherosclerotic heart disease of native coronary artery without angina pectoris: Secondary | ICD-10-CM | POA: Diagnosis not present

## 2019-01-04 DIAGNOSIS — I129 Hypertensive chronic kidney disease with stage 1 through stage 4 chronic kidney disease, or unspecified chronic kidney disease: Secondary | ICD-10-CM | POA: Diagnosis not present

## 2019-01-04 DIAGNOSIS — F0390 Unspecified dementia without behavioral disturbance: Secondary | ICD-10-CM | POA: Diagnosis not present

## 2019-01-04 DIAGNOSIS — G894 Chronic pain syndrome: Secondary | ICD-10-CM | POA: Diagnosis not present

## 2019-01-05 DIAGNOSIS — M6281 Muscle weakness (generalized): Secondary | ICD-10-CM | POA: Diagnosis not present

## 2019-01-05 DIAGNOSIS — R600 Localized edema: Secondary | ICD-10-CM | POA: Diagnosis not present

## 2019-01-05 DIAGNOSIS — F0390 Unspecified dementia without behavioral disturbance: Secondary | ICD-10-CM | POA: Diagnosis not present

## 2019-01-05 DIAGNOSIS — R269 Unspecified abnormalities of gait and mobility: Secondary | ICD-10-CM | POA: Diagnosis not present

## 2019-01-06 DIAGNOSIS — I252 Old myocardial infarction: Secondary | ICD-10-CM | POA: Diagnosis not present

## 2019-01-06 DIAGNOSIS — Z7982 Long term (current) use of aspirin: Secondary | ICD-10-CM | POA: Diagnosis not present

## 2019-01-06 DIAGNOSIS — R609 Edema, unspecified: Secondary | ICD-10-CM | POA: Diagnosis not present

## 2019-01-06 DIAGNOSIS — R5381 Other malaise: Secondary | ICD-10-CM | POA: Diagnosis not present

## 2019-01-06 DIAGNOSIS — M1990 Unspecified osteoarthritis, unspecified site: Secondary | ICD-10-CM | POA: Diagnosis not present

## 2019-01-06 DIAGNOSIS — N401 Enlarged prostate with lower urinary tract symptoms: Secondary | ICD-10-CM | POA: Diagnosis not present

## 2019-01-06 DIAGNOSIS — K219 Gastro-esophageal reflux disease without esophagitis: Secondary | ICD-10-CM | POA: Diagnosis not present

## 2019-01-06 DIAGNOSIS — Z79899 Other long term (current) drug therapy: Secondary | ICD-10-CM | POA: Diagnosis not present

## 2019-01-06 DIAGNOSIS — I10 Essential (primary) hypertension: Secondary | ICD-10-CM | POA: Diagnosis not present

## 2019-01-06 DIAGNOSIS — I2510 Atherosclerotic heart disease of native coronary artery without angina pectoris: Secondary | ICD-10-CM | POA: Diagnosis not present

## 2019-01-06 DIAGNOSIS — K4090 Unilateral inguinal hernia, without obstruction or gangrene, not specified as recurrent: Secondary | ICD-10-CM | POA: Diagnosis not present

## 2019-01-06 DIAGNOSIS — E7800 Pure hypercholesterolemia, unspecified: Secondary | ICD-10-CM | POA: Diagnosis not present

## 2019-01-06 DIAGNOSIS — Z79891 Long term (current) use of opiate analgesic: Secondary | ICD-10-CM | POA: Diagnosis not present

## 2019-01-06 DIAGNOSIS — M2550 Pain in unspecified joint: Secondary | ICD-10-CM | POA: Diagnosis not present

## 2019-01-06 DIAGNOSIS — N3289 Other specified disorders of bladder: Secondary | ICD-10-CM | POA: Diagnosis not present

## 2019-01-06 DIAGNOSIS — Z7401 Bed confinement status: Secondary | ICD-10-CM | POA: Diagnosis not present

## 2019-01-06 DIAGNOSIS — E039 Hypothyroidism, unspecified: Secondary | ICD-10-CM | POA: Diagnosis not present

## 2019-01-10 DIAGNOSIS — N401 Enlarged prostate with lower urinary tract symptoms: Secondary | ICD-10-CM | POA: Diagnosis not present

## 2019-01-10 DIAGNOSIS — F0390 Unspecified dementia without behavioral disturbance: Secondary | ICD-10-CM | POA: Diagnosis not present

## 2019-01-12 DIAGNOSIS — R339 Retention of urine, unspecified: Secondary | ICD-10-CM | POA: Diagnosis not present

## 2019-01-12 DIAGNOSIS — N189 Chronic kidney disease, unspecified: Secondary | ICD-10-CM | POA: Diagnosis not present

## 2019-01-12 DIAGNOSIS — N401 Enlarged prostate with lower urinary tract symptoms: Secondary | ICD-10-CM | POA: Diagnosis not present

## 2019-01-12 DIAGNOSIS — Z125 Encounter for screening for malignant neoplasm of prostate: Secondary | ICD-10-CM | POA: Diagnosis not present

## 2019-01-12 DIAGNOSIS — Z79899 Other long term (current) drug therapy: Secondary | ICD-10-CM | POA: Diagnosis not present

## 2019-01-18 DIAGNOSIS — M159 Polyosteoarthritis, unspecified: Secondary | ICD-10-CM | POA: Diagnosis not present

## 2019-01-18 DIAGNOSIS — I129 Hypertensive chronic kidney disease with stage 1 through stage 4 chronic kidney disease, or unspecified chronic kidney disease: Secondary | ICD-10-CM | POA: Diagnosis not present

## 2019-01-18 DIAGNOSIS — R062 Wheezing: Secondary | ICD-10-CM | POA: Diagnosis not present

## 2019-01-18 DIAGNOSIS — N183 Chronic kidney disease, stage 3 (moderate): Secondary | ICD-10-CM | POA: Diagnosis not present

## 2019-01-18 DIAGNOSIS — R5381 Other malaise: Secondary | ICD-10-CM | POA: Diagnosis not present

## 2019-01-18 DIAGNOSIS — R0989 Other specified symptoms and signs involving the circulatory and respiratory systems: Secondary | ICD-10-CM | POA: Diagnosis not present

## 2019-01-18 DIAGNOSIS — I2510 Atherosclerotic heart disease of native coronary artery without angina pectoris: Secondary | ICD-10-CM | POA: Diagnosis not present

## 2019-01-18 DIAGNOSIS — F0390 Unspecified dementia without behavioral disturbance: Secondary | ICD-10-CM | POA: Diagnosis not present

## 2019-01-18 DIAGNOSIS — M6281 Muscle weakness (generalized): Secondary | ICD-10-CM | POA: Diagnosis not present

## 2019-01-18 DIAGNOSIS — R918 Other nonspecific abnormal finding of lung field: Secondary | ICD-10-CM | POA: Diagnosis not present

## 2019-01-18 DIAGNOSIS — G894 Chronic pain syndrome: Secondary | ICD-10-CM | POA: Diagnosis not present

## 2019-01-18 DIAGNOSIS — N401 Enlarged prostate with lower urinary tract symptoms: Secondary | ICD-10-CM | POA: Diagnosis not present

## 2019-01-19 DIAGNOSIS — D649 Anemia, unspecified: Secondary | ICD-10-CM | POA: Diagnosis not present

## 2019-01-19 DIAGNOSIS — I10 Essential (primary) hypertension: Secondary | ICD-10-CM | POA: Diagnosis not present

## 2019-01-20 DIAGNOSIS — I129 Hypertensive chronic kidney disease with stage 1 through stage 4 chronic kidney disease, or unspecified chronic kidney disease: Secondary | ICD-10-CM | POA: Diagnosis not present

## 2019-01-20 DIAGNOSIS — F0390 Unspecified dementia without behavioral disturbance: Secondary | ICD-10-CM | POA: Diagnosis not present

## 2019-01-20 DIAGNOSIS — Z7982 Long term (current) use of aspirin: Secondary | ICD-10-CM | POA: Diagnosis not present

## 2019-01-20 DIAGNOSIS — N183 Chronic kidney disease, stage 3 (moderate): Secondary | ICD-10-CM | POA: Diagnosis not present

## 2019-01-20 DIAGNOSIS — G894 Chronic pain syndrome: Secondary | ICD-10-CM | POA: Diagnosis not present

## 2019-01-20 DIAGNOSIS — Z9181 History of falling: Secondary | ICD-10-CM | POA: Diagnosis not present

## 2019-01-20 DIAGNOSIS — I2510 Atherosclerotic heart disease of native coronary artery without angina pectoris: Secondary | ICD-10-CM | POA: Diagnosis not present

## 2019-01-20 DIAGNOSIS — F329 Major depressive disorder, single episode, unspecified: Secondary | ICD-10-CM | POA: Diagnosis not present

## 2019-01-20 DIAGNOSIS — M159 Polyosteoarthritis, unspecified: Secondary | ICD-10-CM | POA: Diagnosis not present

## 2019-01-22 DIAGNOSIS — J189 Pneumonia, unspecified organism: Secondary | ICD-10-CM | POA: Diagnosis not present

## 2019-01-22 DIAGNOSIS — R062 Wheezing: Secondary | ICD-10-CM | POA: Diagnosis not present

## 2019-01-25 DIAGNOSIS — I129 Hypertensive chronic kidney disease with stage 1 through stage 4 chronic kidney disease, or unspecified chronic kidney disease: Secondary | ICD-10-CM | POA: Diagnosis not present

## 2019-01-25 DIAGNOSIS — I2510 Atherosclerotic heart disease of native coronary artery without angina pectoris: Secondary | ICD-10-CM | POA: Diagnosis not present

## 2019-01-25 DIAGNOSIS — N183 Chronic kidney disease, stage 3 (moderate): Secondary | ICD-10-CM | POA: Diagnosis not present

## 2019-01-25 DIAGNOSIS — F0390 Unspecified dementia without behavioral disturbance: Secondary | ICD-10-CM | POA: Diagnosis not present

## 2019-01-25 DIAGNOSIS — M159 Polyosteoarthritis, unspecified: Secondary | ICD-10-CM | POA: Diagnosis not present

## 2019-01-25 DIAGNOSIS — G894 Chronic pain syndrome: Secondary | ICD-10-CM | POA: Diagnosis not present

## 2019-01-25 DIAGNOSIS — F329 Major depressive disorder, single episode, unspecified: Secondary | ICD-10-CM | POA: Diagnosis not present

## 2019-02-01 DIAGNOSIS — M159 Polyosteoarthritis, unspecified: Secondary | ICD-10-CM | POA: Diagnosis not present

## 2019-02-01 DIAGNOSIS — I2510 Atherosclerotic heart disease of native coronary artery without angina pectoris: Secondary | ICD-10-CM | POA: Diagnosis not present

## 2019-02-01 DIAGNOSIS — F329 Major depressive disorder, single episode, unspecified: Secondary | ICD-10-CM | POA: Diagnosis not present

## 2019-02-01 DIAGNOSIS — G894 Chronic pain syndrome: Secondary | ICD-10-CM | POA: Diagnosis not present

## 2019-02-01 DIAGNOSIS — I129 Hypertensive chronic kidney disease with stage 1 through stage 4 chronic kidney disease, or unspecified chronic kidney disease: Secondary | ICD-10-CM | POA: Diagnosis not present

## 2019-02-01 DIAGNOSIS — F0390 Unspecified dementia without behavioral disturbance: Secondary | ICD-10-CM | POA: Diagnosis not present

## 2019-02-08 DIAGNOSIS — F0390 Unspecified dementia without behavioral disturbance: Secondary | ICD-10-CM | POA: Diagnosis not present

## 2019-02-08 DIAGNOSIS — I129 Hypertensive chronic kidney disease with stage 1 through stage 4 chronic kidney disease, or unspecified chronic kidney disease: Secondary | ICD-10-CM | POA: Diagnosis not present

## 2019-02-08 DIAGNOSIS — Z23 Encounter for immunization: Secondary | ICD-10-CM | POA: Diagnosis not present

## 2019-02-08 DIAGNOSIS — G894 Chronic pain syndrome: Secondary | ICD-10-CM | POA: Diagnosis not present

## 2019-02-08 DIAGNOSIS — F329 Major depressive disorder, single episode, unspecified: Secondary | ICD-10-CM | POA: Diagnosis not present

## 2019-02-08 DIAGNOSIS — I2510 Atherosclerotic heart disease of native coronary artery without angina pectoris: Secondary | ICD-10-CM | POA: Diagnosis not present

## 2019-02-08 DIAGNOSIS — M159 Polyosteoarthritis, unspecified: Secondary | ICD-10-CM | POA: Diagnosis not present

## 2019-02-15 DIAGNOSIS — I129 Hypertensive chronic kidney disease with stage 1 through stage 4 chronic kidney disease, or unspecified chronic kidney disease: Secondary | ICD-10-CM | POA: Diagnosis not present

## 2019-02-15 DIAGNOSIS — I2510 Atherosclerotic heart disease of native coronary artery without angina pectoris: Secondary | ICD-10-CM | POA: Diagnosis not present

## 2019-02-15 DIAGNOSIS — F0390 Unspecified dementia without behavioral disturbance: Secondary | ICD-10-CM | POA: Diagnosis not present

## 2019-02-15 DIAGNOSIS — M159 Polyosteoarthritis, unspecified: Secondary | ICD-10-CM | POA: Diagnosis not present

## 2019-02-15 DIAGNOSIS — F329 Major depressive disorder, single episode, unspecified: Secondary | ICD-10-CM | POA: Diagnosis not present

## 2019-02-15 DIAGNOSIS — G894 Chronic pain syndrome: Secondary | ICD-10-CM | POA: Diagnosis not present

## 2019-02-19 DIAGNOSIS — I2510 Atherosclerotic heart disease of native coronary artery without angina pectoris: Secondary | ICD-10-CM | POA: Diagnosis not present

## 2019-02-19 DIAGNOSIS — I129 Hypertensive chronic kidney disease with stage 1 through stage 4 chronic kidney disease, or unspecified chronic kidney disease: Secondary | ICD-10-CM | POA: Diagnosis not present

## 2019-02-19 DIAGNOSIS — Z7982 Long term (current) use of aspirin: Secondary | ICD-10-CM | POA: Diagnosis not present

## 2019-02-19 DIAGNOSIS — Z9181 History of falling: Secondary | ICD-10-CM | POA: Diagnosis not present

## 2019-02-19 DIAGNOSIS — F0390 Unspecified dementia without behavioral disturbance: Secondary | ICD-10-CM | POA: Diagnosis not present

## 2019-02-19 DIAGNOSIS — F329 Major depressive disorder, single episode, unspecified: Secondary | ICD-10-CM | POA: Diagnosis not present

## 2019-02-19 DIAGNOSIS — M159 Polyosteoarthritis, unspecified: Secondary | ICD-10-CM | POA: Diagnosis not present

## 2019-02-19 DIAGNOSIS — N183 Chronic kidney disease, stage 3 (moderate): Secondary | ICD-10-CM | POA: Diagnosis not present

## 2019-02-19 DIAGNOSIS — G894 Chronic pain syndrome: Secondary | ICD-10-CM | POA: Diagnosis not present

## 2019-02-22 DIAGNOSIS — G894 Chronic pain syndrome: Secondary | ICD-10-CM | POA: Diagnosis not present

## 2019-02-22 DIAGNOSIS — I2510 Atherosclerotic heart disease of native coronary artery without angina pectoris: Secondary | ICD-10-CM | POA: Diagnosis not present

## 2019-02-22 DIAGNOSIS — N401 Enlarged prostate with lower urinary tract symptoms: Secondary | ICD-10-CM | POA: Diagnosis not present

## 2019-02-22 DIAGNOSIS — F0390 Unspecified dementia without behavioral disturbance: Secondary | ICD-10-CM | POA: Diagnosis not present

## 2019-02-22 DIAGNOSIS — R339 Retention of urine, unspecified: Secondary | ICD-10-CM | POA: Diagnosis not present

## 2019-02-22 DIAGNOSIS — M159 Polyosteoarthritis, unspecified: Secondary | ICD-10-CM | POA: Diagnosis not present

## 2019-02-22 DIAGNOSIS — F329 Major depressive disorder, single episode, unspecified: Secondary | ICD-10-CM | POA: Diagnosis not present

## 2019-02-22 DIAGNOSIS — I129 Hypertensive chronic kidney disease with stage 1 through stage 4 chronic kidney disease, or unspecified chronic kidney disease: Secondary | ICD-10-CM | POA: Diagnosis not present

## 2019-02-26 DIAGNOSIS — R0902 Hypoxemia: Secondary | ICD-10-CM | POA: Diagnosis not present

## 2019-02-26 DIAGNOSIS — Z743 Need for continuous supervision: Secondary | ICD-10-CM | POA: Diagnosis not present

## 2019-02-26 DIAGNOSIS — R5381 Other malaise: Secondary | ICD-10-CM | POA: Diagnosis not present

## 2019-02-26 DIAGNOSIS — R630 Anorexia: Secondary | ICD-10-CM | POA: Diagnosis not present

## 2019-02-26 DIAGNOSIS — K4091 Unilateral inguinal hernia, without obstruction or gangrene, recurrent: Secondary | ICD-10-CM | POA: Diagnosis not present

## 2019-02-26 DIAGNOSIS — F0390 Unspecified dementia without behavioral disturbance: Secondary | ICD-10-CM | POA: Diagnosis not present

## 2019-02-26 DIAGNOSIS — K4090 Unilateral inguinal hernia, without obstruction or gangrene, not specified as recurrent: Secondary | ICD-10-CM | POA: Diagnosis not present

## 2019-02-26 DIAGNOSIS — R279 Unspecified lack of coordination: Secondary | ICD-10-CM | POA: Diagnosis not present

## 2019-02-26 DIAGNOSIS — K5730 Diverticulosis of large intestine without perforation or abscess without bleeding: Secondary | ICD-10-CM | POA: Diagnosis not present

## 2019-02-26 DIAGNOSIS — R1084 Generalized abdominal pain: Secondary | ICD-10-CM | POA: Diagnosis not present

## 2019-02-26 DIAGNOSIS — G894 Chronic pain syndrome: Secondary | ICD-10-CM | POA: Diagnosis not present

## 2019-02-26 DIAGNOSIS — N433 Hydrocele, unspecified: Secondary | ICD-10-CM | POA: Diagnosis not present

## 2019-02-26 DIAGNOSIS — R140 Abdominal distension (gaseous): Secondary | ICD-10-CM | POA: Diagnosis not present

## 2019-02-26 DIAGNOSIS — R52 Pain, unspecified: Secondary | ICD-10-CM | POA: Diagnosis not present

## 2019-02-26 DIAGNOSIS — R109 Unspecified abdominal pain: Secondary | ICD-10-CM | POA: Diagnosis not present

## 2019-02-28 DIAGNOSIS — K4091 Unilateral inguinal hernia, without obstruction or gangrene, recurrent: Secondary | ICD-10-CM | POA: Diagnosis not present

## 2019-02-28 DIAGNOSIS — R1030 Lower abdominal pain, unspecified: Secondary | ICD-10-CM | POA: Diagnosis not present

## 2019-02-28 DIAGNOSIS — N433 Hydrocele, unspecified: Secondary | ICD-10-CM | POA: Diagnosis not present

## 2019-02-28 DIAGNOSIS — R54 Age-related physical debility: Secondary | ICD-10-CM | POA: Diagnosis not present

## 2019-03-01 DIAGNOSIS — I2510 Atherosclerotic heart disease of native coronary artery without angina pectoris: Secondary | ICD-10-CM | POA: Diagnosis not present

## 2019-03-01 DIAGNOSIS — F0390 Unspecified dementia without behavioral disturbance: Secondary | ICD-10-CM | POA: Diagnosis not present

## 2019-03-01 DIAGNOSIS — I129 Hypertensive chronic kidney disease with stage 1 through stage 4 chronic kidney disease, or unspecified chronic kidney disease: Secondary | ICD-10-CM | POA: Diagnosis not present

## 2019-03-01 DIAGNOSIS — M159 Polyosteoarthritis, unspecified: Secondary | ICD-10-CM | POA: Diagnosis not present

## 2019-03-01 DIAGNOSIS — F329 Major depressive disorder, single episode, unspecified: Secondary | ICD-10-CM | POA: Diagnosis not present

## 2019-03-01 DIAGNOSIS — G894 Chronic pain syndrome: Secondary | ICD-10-CM | POA: Diagnosis not present

## 2019-03-08 DIAGNOSIS — F329 Major depressive disorder, single episode, unspecified: Secondary | ICD-10-CM | POA: Diagnosis not present

## 2019-03-08 DIAGNOSIS — F0390 Unspecified dementia without behavioral disturbance: Secondary | ICD-10-CM | POA: Diagnosis not present

## 2019-03-08 DIAGNOSIS — I2510 Atherosclerotic heart disease of native coronary artery without angina pectoris: Secondary | ICD-10-CM | POA: Diagnosis not present

## 2019-03-08 DIAGNOSIS — M159 Polyosteoarthritis, unspecified: Secondary | ICD-10-CM | POA: Diagnosis not present

## 2019-03-08 DIAGNOSIS — I129 Hypertensive chronic kidney disease with stage 1 through stage 4 chronic kidney disease, or unspecified chronic kidney disease: Secondary | ICD-10-CM | POA: Diagnosis not present

## 2019-03-08 DIAGNOSIS — G894 Chronic pain syndrome: Secondary | ICD-10-CM | POA: Diagnosis not present

## 2019-03-15 DIAGNOSIS — M159 Polyosteoarthritis, unspecified: Secondary | ICD-10-CM | POA: Diagnosis not present

## 2019-03-15 DIAGNOSIS — I129 Hypertensive chronic kidney disease with stage 1 through stage 4 chronic kidney disease, or unspecified chronic kidney disease: Secondary | ICD-10-CM | POA: Diagnosis not present

## 2019-03-15 DIAGNOSIS — I2510 Atherosclerotic heart disease of native coronary artery without angina pectoris: Secondary | ICD-10-CM | POA: Diagnosis not present

## 2019-03-15 DIAGNOSIS — F329 Major depressive disorder, single episode, unspecified: Secondary | ICD-10-CM | POA: Diagnosis not present

## 2019-03-15 DIAGNOSIS — G894 Chronic pain syndrome: Secondary | ICD-10-CM | POA: Diagnosis not present

## 2019-03-15 DIAGNOSIS — F0390 Unspecified dementia without behavioral disturbance: Secondary | ICD-10-CM | POA: Diagnosis not present

## 2019-03-21 DIAGNOSIS — G894 Chronic pain syndrome: Secondary | ICD-10-CM | POA: Diagnosis not present

## 2019-03-21 DIAGNOSIS — I129 Hypertensive chronic kidney disease with stage 1 through stage 4 chronic kidney disease, or unspecified chronic kidney disease: Secondary | ICD-10-CM | POA: Diagnosis not present

## 2019-03-21 DIAGNOSIS — K4030 Unilateral inguinal hernia, with obstruction, without gangrene, not specified as recurrent: Secondary | ICD-10-CM | POA: Diagnosis not present

## 2019-03-21 DIAGNOSIS — Z9181 History of falling: Secondary | ICD-10-CM | POA: Diagnosis not present

## 2019-03-21 DIAGNOSIS — M159 Polyosteoarthritis, unspecified: Secondary | ICD-10-CM | POA: Diagnosis not present

## 2019-03-21 DIAGNOSIS — Z7982 Long term (current) use of aspirin: Secondary | ICD-10-CM | POA: Diagnosis not present

## 2019-03-21 DIAGNOSIS — F329 Major depressive disorder, single episode, unspecified: Secondary | ICD-10-CM | POA: Diagnosis not present

## 2019-03-21 DIAGNOSIS — F0390 Unspecified dementia without behavioral disturbance: Secondary | ICD-10-CM | POA: Diagnosis not present

## 2019-03-21 DIAGNOSIS — N1832 Chronic kidney disease, stage 3b: Secondary | ICD-10-CM | POA: Diagnosis not present

## 2019-03-21 DIAGNOSIS — I2510 Atherosclerotic heart disease of native coronary artery without angina pectoris: Secondary | ICD-10-CM | POA: Diagnosis not present

## 2019-03-22 DIAGNOSIS — N1832 Chronic kidney disease, stage 3b: Secondary | ICD-10-CM | POA: Diagnosis not present

## 2019-03-22 DIAGNOSIS — I2510 Atherosclerotic heart disease of native coronary artery without angina pectoris: Secondary | ICD-10-CM | POA: Diagnosis not present

## 2019-03-22 DIAGNOSIS — F0390 Unspecified dementia without behavioral disturbance: Secondary | ICD-10-CM | POA: Diagnosis not present

## 2019-03-22 DIAGNOSIS — G894 Chronic pain syndrome: Secondary | ICD-10-CM | POA: Diagnosis not present

## 2019-03-22 DIAGNOSIS — M159 Polyosteoarthritis, unspecified: Secondary | ICD-10-CM | POA: Diagnosis not present

## 2019-03-22 DIAGNOSIS — I129 Hypertensive chronic kidney disease with stage 1 through stage 4 chronic kidney disease, or unspecified chronic kidney disease: Secondary | ICD-10-CM | POA: Diagnosis not present

## 2019-03-26 DIAGNOSIS — S40021A Contusion of right upper arm, initial encounter: Secondary | ICD-10-CM | POA: Diagnosis not present

## 2019-03-26 DIAGNOSIS — R600 Localized edema: Secondary | ICD-10-CM | POA: Diagnosis not present

## 2019-03-26 DIAGNOSIS — F0390 Unspecified dementia without behavioral disturbance: Secondary | ICD-10-CM | POA: Diagnosis not present

## 2019-03-26 DIAGNOSIS — S90829A Blister (nonthermal), unspecified foot, initial encounter: Secondary | ICD-10-CM | POA: Diagnosis not present

## 2019-03-27 DIAGNOSIS — N433 Hydrocele, unspecified: Secondary | ICD-10-CM | POA: Diagnosis not present

## 2019-03-27 DIAGNOSIS — N401 Enlarged prostate with lower urinary tract symptoms: Secondary | ICD-10-CM | POA: Diagnosis not present

## 2019-03-29 DIAGNOSIS — G894 Chronic pain syndrome: Secondary | ICD-10-CM | POA: Diagnosis not present

## 2019-03-29 DIAGNOSIS — I2510 Atherosclerotic heart disease of native coronary artery without angina pectoris: Secondary | ICD-10-CM | POA: Diagnosis not present

## 2019-03-29 DIAGNOSIS — F0390 Unspecified dementia without behavioral disturbance: Secondary | ICD-10-CM | POA: Diagnosis not present

## 2019-03-29 DIAGNOSIS — M159 Polyosteoarthritis, unspecified: Secondary | ICD-10-CM | POA: Diagnosis not present

## 2019-03-29 DIAGNOSIS — I129 Hypertensive chronic kidney disease with stage 1 through stage 4 chronic kidney disease, or unspecified chronic kidney disease: Secondary | ICD-10-CM | POA: Diagnosis not present

## 2019-03-29 DIAGNOSIS — N1832 Chronic kidney disease, stage 3b: Secondary | ICD-10-CM | POA: Diagnosis not present

## 2019-04-02 DIAGNOSIS — D510 Vitamin B12 deficiency anemia due to intrinsic factor deficiency: Secondary | ICD-10-CM | POA: Diagnosis not present

## 2019-04-02 DIAGNOSIS — N179 Acute kidney failure, unspecified: Secondary | ICD-10-CM | POA: Diagnosis not present

## 2019-04-02 DIAGNOSIS — D631 Anemia in chronic kidney disease: Secondary | ICD-10-CM | POA: Diagnosis not present

## 2019-04-04 DIAGNOSIS — R54 Age-related physical debility: Secondary | ICD-10-CM | POA: Diagnosis not present

## 2019-04-04 DIAGNOSIS — Z20828 Contact with and (suspected) exposure to other viral communicable diseases: Secondary | ICD-10-CM | POA: Diagnosis not present

## 2019-04-05 DIAGNOSIS — W050XXA Fall from non-moving wheelchair, initial encounter: Secondary | ICD-10-CM | POA: Diagnosis not present

## 2019-04-05 DIAGNOSIS — Z87440 Personal history of urinary (tract) infections: Secondary | ICD-10-CM | POA: Diagnosis not present

## 2019-04-05 DIAGNOSIS — M6281 Muscle weakness (generalized): Secondary | ICD-10-CM | POA: Diagnosis not present

## 2019-04-05 DIAGNOSIS — R4182 Altered mental status, unspecified: Secondary | ICD-10-CM | POA: Diagnosis not present

## 2019-04-05 DIAGNOSIS — I129 Hypertensive chronic kidney disease with stage 1 through stage 4 chronic kidney disease, or unspecified chronic kidney disease: Secondary | ICD-10-CM | POA: Diagnosis not present

## 2019-04-05 DIAGNOSIS — R269 Unspecified abnormalities of gait and mobility: Secondary | ICD-10-CM | POA: Diagnosis not present

## 2019-04-05 DIAGNOSIS — G894 Chronic pain syndrome: Secondary | ICD-10-CM | POA: Diagnosis not present

## 2019-04-05 DIAGNOSIS — F0390 Unspecified dementia without behavioral disturbance: Secondary | ICD-10-CM | POA: Diagnosis not present

## 2019-04-05 DIAGNOSIS — Z9181 History of falling: Secondary | ICD-10-CM | POA: Diagnosis not present

## 2019-04-05 DIAGNOSIS — I2510 Atherosclerotic heart disease of native coronary artery without angina pectoris: Secondary | ICD-10-CM | POA: Diagnosis not present

## 2019-04-05 DIAGNOSIS — M159 Polyosteoarthritis, unspecified: Secondary | ICD-10-CM | POA: Diagnosis not present

## 2019-04-05 DIAGNOSIS — N1832 Chronic kidney disease, stage 3b: Secondary | ICD-10-CM | POA: Diagnosis not present

## 2019-04-09 DIAGNOSIS — I10 Essential (primary) hypertension: Secondary | ICD-10-CM | POA: Diagnosis not present

## 2019-04-09 DIAGNOSIS — D649 Anemia, unspecified: Secondary | ICD-10-CM | POA: Diagnosis not present

## 2019-04-09 DIAGNOSIS — E538 Deficiency of other specified B group vitamins: Secondary | ICD-10-CM | POA: Diagnosis not present

## 2019-04-09 DIAGNOSIS — F0390 Unspecified dementia without behavioral disturbance: Secondary | ICD-10-CM | POA: Diagnosis not present

## 2019-04-09 DIAGNOSIS — R799 Abnormal finding of blood chemistry, unspecified: Secondary | ICD-10-CM | POA: Diagnosis not present

## 2019-04-11 DIAGNOSIS — R54 Age-related physical debility: Secondary | ICD-10-CM | POA: Diagnosis not present

## 2019-04-11 DIAGNOSIS — Z20828 Contact with and (suspected) exposure to other viral communicable diseases: Secondary | ICD-10-CM | POA: Diagnosis not present

## 2019-04-12 DIAGNOSIS — F0390 Unspecified dementia without behavioral disturbance: Secondary | ICD-10-CM | POA: Diagnosis not present

## 2019-04-12 DIAGNOSIS — G894 Chronic pain syndrome: Secondary | ICD-10-CM | POA: Diagnosis not present

## 2019-04-12 DIAGNOSIS — N1832 Chronic kidney disease, stage 3b: Secondary | ICD-10-CM | POA: Diagnosis not present

## 2019-04-12 DIAGNOSIS — I2510 Atherosclerotic heart disease of native coronary artery without angina pectoris: Secondary | ICD-10-CM | POA: Diagnosis not present

## 2019-04-12 DIAGNOSIS — I129 Hypertensive chronic kidney disease with stage 1 through stage 4 chronic kidney disease, or unspecified chronic kidney disease: Secondary | ICD-10-CM | POA: Diagnosis not present

## 2019-04-12 DIAGNOSIS — M159 Polyosteoarthritis, unspecified: Secondary | ICD-10-CM | POA: Diagnosis not present

## 2019-04-17 DIAGNOSIS — N390 Urinary tract infection, site not specified: Secondary | ICD-10-CM | POA: Diagnosis not present

## 2019-04-18 DIAGNOSIS — Z20828 Contact with and (suspected) exposure to other viral communicable diseases: Secondary | ICD-10-CM | POA: Diagnosis not present

## 2019-04-18 DIAGNOSIS — N1832 Chronic kidney disease, stage 3b: Secondary | ICD-10-CM | POA: Diagnosis not present

## 2019-04-18 DIAGNOSIS — G894 Chronic pain syndrome: Secondary | ICD-10-CM | POA: Diagnosis not present

## 2019-04-18 DIAGNOSIS — M159 Polyosteoarthritis, unspecified: Secondary | ICD-10-CM | POA: Diagnosis not present

## 2019-04-18 DIAGNOSIS — F0390 Unspecified dementia without behavioral disturbance: Secondary | ICD-10-CM | POA: Diagnosis not present

## 2019-04-18 DIAGNOSIS — I129 Hypertensive chronic kidney disease with stage 1 through stage 4 chronic kidney disease, or unspecified chronic kidney disease: Secondary | ICD-10-CM | POA: Diagnosis not present

## 2019-04-18 DIAGNOSIS — I2510 Atherosclerotic heart disease of native coronary artery without angina pectoris: Secondary | ICD-10-CM | POA: Diagnosis not present

## 2019-04-20 DIAGNOSIS — G894 Chronic pain syndrome: Secondary | ICD-10-CM | POA: Diagnosis not present

## 2019-04-20 DIAGNOSIS — F329 Major depressive disorder, single episode, unspecified: Secondary | ICD-10-CM | POA: Diagnosis not present

## 2019-04-20 DIAGNOSIS — M159 Polyosteoarthritis, unspecified: Secondary | ICD-10-CM | POA: Diagnosis not present

## 2019-04-20 DIAGNOSIS — Z7982 Long term (current) use of aspirin: Secondary | ICD-10-CM | POA: Diagnosis not present

## 2019-04-20 DIAGNOSIS — Z9181 History of falling: Secondary | ICD-10-CM | POA: Diagnosis not present

## 2019-04-20 DIAGNOSIS — F0390 Unspecified dementia without behavioral disturbance: Secondary | ICD-10-CM | POA: Diagnosis not present

## 2019-04-20 DIAGNOSIS — I2510 Atherosclerotic heart disease of native coronary artery without angina pectoris: Secondary | ICD-10-CM | POA: Diagnosis not present

## 2019-04-20 DIAGNOSIS — N1832 Chronic kidney disease, stage 3b: Secondary | ICD-10-CM | POA: Diagnosis not present

## 2019-04-20 DIAGNOSIS — I129 Hypertensive chronic kidney disease with stage 1 through stage 4 chronic kidney disease, or unspecified chronic kidney disease: Secondary | ICD-10-CM | POA: Diagnosis not present

## 2019-04-26 DIAGNOSIS — N1832 Chronic kidney disease, stage 3b: Secondary | ICD-10-CM | POA: Diagnosis not present

## 2019-04-26 DIAGNOSIS — I129 Hypertensive chronic kidney disease with stage 1 through stage 4 chronic kidney disease, or unspecified chronic kidney disease: Secondary | ICD-10-CM | POA: Diagnosis not present

## 2019-04-26 DIAGNOSIS — G894 Chronic pain syndrome: Secondary | ICD-10-CM | POA: Diagnosis not present

## 2019-04-26 DIAGNOSIS — F0390 Unspecified dementia without behavioral disturbance: Secondary | ICD-10-CM | POA: Diagnosis not present

## 2019-04-26 DIAGNOSIS — M159 Polyosteoarthritis, unspecified: Secondary | ICD-10-CM | POA: Diagnosis not present

## 2019-04-26 DIAGNOSIS — I2510 Atherosclerotic heart disease of native coronary artery without angina pectoris: Secondary | ICD-10-CM | POA: Diagnosis not present

## 2019-05-02 DIAGNOSIS — Z20828 Contact with and (suspected) exposure to other viral communicable diseases: Secondary | ICD-10-CM | POA: Diagnosis not present

## 2019-05-03 DIAGNOSIS — E039 Hypothyroidism, unspecified: Secondary | ICD-10-CM | POA: Diagnosis not present

## 2019-05-03 DIAGNOSIS — D510 Vitamin B12 deficiency anemia due to intrinsic factor deficiency: Secondary | ICD-10-CM | POA: Diagnosis not present

## 2019-05-03 DIAGNOSIS — D631 Anemia in chronic kidney disease: Secondary | ICD-10-CM | POA: Diagnosis not present

## 2019-05-03 DIAGNOSIS — I10 Essential (primary) hypertension: Secondary | ICD-10-CM | POA: Diagnosis not present

## 2019-05-03 DIAGNOSIS — E559 Vitamin D deficiency, unspecified: Secondary | ICD-10-CM | POA: Diagnosis not present

## 2019-05-03 DIAGNOSIS — E215 Disorder of parathyroid gland, unspecified: Secondary | ICD-10-CM | POA: Diagnosis not present

## 2019-05-03 DIAGNOSIS — N179 Acute kidney failure, unspecified: Secondary | ICD-10-CM | POA: Diagnosis not present

## 2019-05-07 DIAGNOSIS — Z7689 Persons encountering health services in other specified circumstances: Secondary | ICD-10-CM | POA: Diagnosis not present

## 2019-05-09 DIAGNOSIS — R54 Age-related physical debility: Secondary | ICD-10-CM | POA: Diagnosis not present

## 2019-05-09 DIAGNOSIS — Z20828 Contact with and (suspected) exposure to other viral communicable diseases: Secondary | ICD-10-CM | POA: Diagnosis not present

## 2019-05-16 DIAGNOSIS — Z20828 Contact with and (suspected) exposure to other viral communicable diseases: Secondary | ICD-10-CM | POA: Diagnosis not present

## 2019-05-16 DIAGNOSIS — R54 Age-related physical debility: Secondary | ICD-10-CM | POA: Diagnosis not present

## 2019-05-23 DIAGNOSIS — Z20828 Contact with and (suspected) exposure to other viral communicable diseases: Secondary | ICD-10-CM | POA: Diagnosis not present

## 2019-05-23 DIAGNOSIS — R54 Age-related physical debility: Secondary | ICD-10-CM | POA: Diagnosis not present

## 2019-05-28 DIAGNOSIS — M6281 Muscle weakness (generalized): Secondary | ICD-10-CM | POA: Diagnosis not present

## 2019-05-28 DIAGNOSIS — U071 COVID-19: Secondary | ICD-10-CM | POA: Diagnosis not present

## 2019-05-28 DIAGNOSIS — R269 Unspecified abnormalities of gait and mobility: Secondary | ICD-10-CM | POA: Diagnosis not present

## 2019-05-28 DIAGNOSIS — F0390 Unspecified dementia without behavioral disturbance: Secondary | ICD-10-CM | POA: Diagnosis not present

## 2019-05-30 DIAGNOSIS — R4181 Age-related cognitive decline: Secondary | ICD-10-CM | POA: Diagnosis not present

## 2019-05-30 DIAGNOSIS — F0390 Unspecified dementia without behavioral disturbance: Secondary | ICD-10-CM | POA: Diagnosis not present

## 2019-05-30 DIAGNOSIS — U071 COVID-19: Secondary | ICD-10-CM | POA: Diagnosis not present

## 2019-05-30 DIAGNOSIS — M6281 Muscle weakness (generalized): Secondary | ICD-10-CM | POA: Diagnosis not present

## 2019-05-30 DIAGNOSIS — R630 Anorexia: Secondary | ICD-10-CM | POA: Diagnosis not present

## 2019-05-30 DIAGNOSIS — R269 Unspecified abnormalities of gait and mobility: Secondary | ICD-10-CM | POA: Diagnosis not present

## 2019-06-06 DIAGNOSIS — G894 Chronic pain syndrome: Secondary | ICD-10-CM | POA: Diagnosis not present

## 2019-06-06 DIAGNOSIS — U071 COVID-19: Secondary | ICD-10-CM | POA: Diagnosis not present

## 2019-06-06 DIAGNOSIS — F0280 Dementia in other diseases classified elsewhere without behavioral disturbance: Secondary | ICD-10-CM | POA: Diagnosis not present

## 2019-06-06 DIAGNOSIS — G301 Alzheimer's disease with late onset: Secondary | ICD-10-CM | POA: Diagnosis not present

## 2019-06-07 DIAGNOSIS — U071 COVID-19: Secondary | ICD-10-CM | POA: Diagnosis not present

## 2019-06-07 DIAGNOSIS — G894 Chronic pain syndrome: Secondary | ICD-10-CM | POA: Diagnosis not present

## 2019-06-07 DIAGNOSIS — G301 Alzheimer's disease with late onset: Secondary | ICD-10-CM | POA: Diagnosis not present

## 2019-06-07 DIAGNOSIS — F0280 Dementia in other diseases classified elsewhere without behavioral disturbance: Secondary | ICD-10-CM | POA: Diagnosis not present

## 2019-06-25 DEATH — deceased
